# Patient Record
Sex: Female | Born: 1955 | Race: White | Hispanic: No | Marital: Married | Smoking: Current every day smoker
Health system: Southern US, Community
[De-identification: ages and names within clinical notes are randomized; demographics above are authoritative.]

## PROBLEM LIST (undated history)

## (undated) DIAGNOSIS — K529 Noninfective gastroenteritis and colitis, unspecified: Secondary | ICD-10-CM

## (undated) DIAGNOSIS — F419 Anxiety disorder, unspecified: Secondary | ICD-10-CM

## (undated) HISTORY — PX: COSMETIC SURGERY: SHX468

## (undated) HISTORY — PX: ABDOMINOPLASTY: SUR9

## (undated) HISTORY — PX: COLON SURGERY: SHX602

## (undated) HISTORY — DX: Noninfective gastroenteritis and colitis, unspecified: K52.9

## (undated) HISTORY — DX: Anxiety disorder, unspecified: F41.9

---

## 1989-08-15 HISTORY — PX: COLONOSCOPY W/ POLYPECTOMY: SHX1380

## 1998-10-15 ENCOUNTER — Other Ambulatory Visit: Admission: RE | Admit: 1998-10-15 | Discharge: 1998-10-15 | Payer: Self-pay | Admitting: Gynecology

## 1999-05-31 ENCOUNTER — Encounter: Admission: RE | Admit: 1999-05-31 | Discharge: 1999-05-31 | Payer: Self-pay | Admitting: Gynecology

## 1999-12-03 ENCOUNTER — Other Ambulatory Visit: Admission: RE | Admit: 1999-12-03 | Discharge: 1999-12-03 | Payer: Self-pay | Admitting: Gynecology

## 2000-07-17 ENCOUNTER — Encounter: Payer: Self-pay | Admitting: Gynecology

## 2000-07-17 ENCOUNTER — Encounter: Admission: RE | Admit: 2000-07-17 | Discharge: 2000-07-17 | Payer: Self-pay | Admitting: Gynecology

## 2001-04-05 ENCOUNTER — Other Ambulatory Visit: Admission: RE | Admit: 2001-04-05 | Discharge: 2001-04-05 | Payer: Self-pay | Admitting: Gynecology

## 2002-04-08 ENCOUNTER — Other Ambulatory Visit: Admission: RE | Admit: 2002-04-08 | Discharge: 2002-04-08 | Payer: Self-pay | Admitting: Gynecology

## 2002-11-13 ENCOUNTER — Encounter: Admission: RE | Admit: 2002-11-13 | Discharge: 2002-11-13 | Payer: Self-pay | Admitting: Gynecology

## 2002-11-13 ENCOUNTER — Encounter: Payer: Self-pay | Admitting: Gynecology

## 2005-07-19 ENCOUNTER — Other Ambulatory Visit: Admission: RE | Admit: 2005-07-19 | Discharge: 2005-07-19 | Payer: Self-pay | Admitting: Gynecology

## 2005-08-01 ENCOUNTER — Encounter: Admission: RE | Admit: 2005-08-01 | Discharge: 2005-08-01 | Payer: Self-pay | Admitting: Gynecology

## 2006-09-07 ENCOUNTER — Encounter: Admission: RE | Admit: 2006-09-07 | Discharge: 2006-09-07 | Payer: Self-pay | Admitting: Gynecology

## 2011-08-23 ENCOUNTER — Other Ambulatory Visit: Payer: Self-pay | Admitting: Gynecology

## 2011-08-23 DIAGNOSIS — Z1231 Encounter for screening mammogram for malignant neoplasm of breast: Secondary | ICD-10-CM

## 2011-09-05 ENCOUNTER — Ambulatory Visit
Admission: RE | Admit: 2011-09-05 | Discharge: 2011-09-05 | Disposition: A | Discharge status: Home / Self Care | Payer: BC Managed Care – PPO | Source: Ambulatory Visit | Attending: Gynecology | Admitting: Gynecology

## 2011-09-05 DIAGNOSIS — Z1231 Encounter for screening mammogram for malignant neoplasm of breast: Secondary | ICD-10-CM

## 2011-09-09 ENCOUNTER — Other Ambulatory Visit: Payer: Self-pay | Admitting: Gynecology

## 2011-09-09 DIAGNOSIS — R928 Other abnormal and inconclusive findings on diagnostic imaging of breast: Secondary | ICD-10-CM

## 2011-09-19 ENCOUNTER — Ambulatory Visit
Admission: RE | Admit: 2011-09-19 | Discharge: 2011-09-19 | Disposition: A | Discharge status: Home / Self Care | Payer: BC Managed Care – PPO | Source: Ambulatory Visit | Attending: Gynecology | Admitting: Gynecology

## 2011-09-19 DIAGNOSIS — R928 Other abnormal and inconclusive findings on diagnostic imaging of breast: Secondary | ICD-10-CM

## 2013-07-09 ENCOUNTER — Other Ambulatory Visit: Payer: Self-pay

## 2013-07-09 DIAGNOSIS — Z1231 Encounter for screening mammogram for malignant neoplasm of breast: Secondary | ICD-10-CM

## 2013-08-19 ENCOUNTER — Ambulatory Visit
Admission: RE | Admit: 2013-08-19 | Discharge: 2013-08-19 | Disposition: A | Discharge status: Home / Self Care | Payer: BC Managed Care – PPO | Source: Ambulatory Visit

## 2013-08-19 DIAGNOSIS — Z1231 Encounter for screening mammogram for malignant neoplasm of breast: Secondary | ICD-10-CM

## 2015-05-06 ENCOUNTER — Ambulatory Visit (INDEPENDENT_AMBULATORY_CARE_PROVIDER_SITE_OTHER): Payer: BLUE CROSS/BLUE SHIELD | Admitting: Physician Assistant

## 2015-05-06 VITALS — BP 126/68 | HR 73 | Temp 98.8°F | Resp 18 | Ht 64.5 in | Wt 124.0 lb

## 2015-05-06 DIAGNOSIS — R300 Dysuria: Secondary | ICD-10-CM | POA: Diagnosis not present

## 2015-05-06 LAB — POCT URINALYSIS DIP (MANUAL ENTRY)
BILIRUBIN UA: NEGATIVE
Blood, UA: NEGATIVE
Glucose, UA: NEGATIVE
Ketones, POC UA: NEGATIVE
LEUKOCYTES UA: NEGATIVE
NITRITE UA: NEGATIVE
PH UA: 7.5
PROTEIN UA: NEGATIVE
Spec Grav, UA: 1.02
Urobilinogen, UA: 1

## 2015-05-06 LAB — POC MICROSCOPIC URINALYSIS (UMFC): Mucus: ABSENT

## 2015-05-06 MED ORDER — NITROFURANTOIN MONOHYD MACRO 100 MG PO CAPS: 100.0000 mg | ORAL_CAPSULE | Freq: Two times a day (BID) | ORAL | Status: AC

## 2015-05-06 MED ORDER — PHENAZOPYRIDINE HCL 200 MG PO TABS: 200.0000 mg | ORAL_TABLET | Freq: Three times a day (TID) | ORAL | Status: DC | PRN

## 2015-05-06 NOTE — Progress Notes (Signed)
   Brandi Lin  MRN: [id number] DOB: 1956/03/27  Subjective:  Pt presents to clinic with about 6 hours of dysuria, urinary frequency and  Urgency.  She was woken with the urge to urinate and then this did not stopped.  Currently the symptoms has gotten better.  She has tried nothing OTC.  She does not get regular UTIs, last one several months ago and then before that years ago.  She is sexually active without a change in partner and she is having no vaginal symptoms.  There are no active problems to display for this patient.   No current outpatient prescriptions on file prior to visit.   No current facility-administered medications on file prior to visit.    No Known Allergies  Review of Systems  Constitutional: Negative for fever and chills.  Gastrointestinal: Negative for nausea, vomiting and abdominal pain.  Genitourinary: Positive for dysuria, urgency and frequency. Negative for vaginal discharge.  Musculoskeletal: Negative for back pain.   Objective:  BP 126/68 mmHg  Pulse 73  Temp(Src) 98.8 F (37.1 C) (Oral)  Resp 18  Ht 5' 4.5" (1.638 m)  Wt 124 lb (56.246 kg)  BMI 20.96 kg/m2  SpO2 99%  Physical Exam  Constitutional: She is oriented to person, place, and time and well-developed, well-nourished, and in no distress.  HENT:  Head: Normocephalic and atraumatic.  Right Ear: External ear normal.  Left Ear: External ear normal.  Cardiovascular: Normal rate, regular rhythm and normal heart sounds.   No murmur heard. Pulmonary/Chest: Effort normal and breath sounds normal.  Abdominal: Soft. There is no CVA tenderness.  Neurological: She is alert and oriented to person, place, and time. Gait normal.  Skin: Skin is warm and dry.  Psychiatric: Mood, memory, affect and judgment normal.  Vitals reviewed.  Results for orders placed or performed in visit on 05/06/15  POCT urinalysis dipstick  Result Value Ref Range   Color, UA other (A) yellow   Clarity, UA cloudy (A)  clear   Glucose, UA negative negative   Bilirubin, UA negative negative   Ketones, POC UA negative negative   Spec Grav, UA 1.020    Blood, UA negative negative   pH, UA 7.5    Protein Ur, POC negative negative   Urobilinogen, UA 1.0    Nitrite, UA Negative Negative   Leukocytes, UA Negative Negative  POCT Microscopic Urinalysis ([facility])  Result Value Ref Range   WBC,UR,HPF,POC Few (A) None WBC/hpf   RBC,UR,HPF,POC None None RBC/hpf   Bacteria None None   Mucus Absent Absent   Epithelial Cells, UR Per Microscopy None None cells/hpf    Assessment and Plan :  Burning with urination - Plan: POCT urinalysis dipstick, POCT Microscopic Urinalysis ([facility]), Urine culture, phenazopyridine (PYRIDIUM) 200 MG tablet, nitrofurantoin, macrocrystal-monohydrate, (MACROBID) 100 MG capsule  Push fluids.  Pt does not have an obvious UTI but it is early and she has symptoms so we will treat.  Benny Lennert PA-C  [facility] 05/06/2015 11:37 AM

## 2015-05-08 LAB — URINE CULTURE: Colony Count: >=100000

## 2015-08-31 ENCOUNTER — Ambulatory Visit (INDEPENDENT_AMBULATORY_CARE_PROVIDER_SITE_OTHER): Payer: BLUE CROSS/BLUE SHIELD | Admitting: Family Medicine

## 2015-08-31 VITALS — BP 126/76 | HR 91 | Temp 98.6°F | Resp 17 | Ht 64.5 in | Wt 127.0 lb

## 2015-08-31 DIAGNOSIS — H00011 Hordeolum externum right upper eyelid: Secondary | ICD-10-CM

## 2015-08-31 NOTE — Progress Notes (Signed)
   Subjective:    Patient ID: Brandi Lin, female    DOB: 27-Mar-1956, 60 y.o.   MRN: [id number]  HPI Patient presents today with 2 days of upper right eye lid pain and swelling. She has not had any fever, eye pain, runny nose, sore throat or cough. She has not had any visual changes or foreign body sensation. She was around her grandson who had pink eye last week, but she has not had any conjunctival redness.   She has regular care from Dr. Kirby FunkJohn Lin at [facility] and reports that she is up to date on PAP, mammogram. She takes no medications.   No past medical history on file. Past Surgical History  Procedure Laterality Date  . Colon surgery    . Cosmetic surgery     Family History  Problem Relation Age of Onset  . Cancer Mother   . Cancer Maternal Grandmother   . Cancer Maternal Grandfather   . Cancer Paternal Grandfather    Social History   Social History  . Marital Status: Married    Spouse Name: N/A  . Number of Children: N/A  . Years of Education: N/A   Occupational History  . Not on file.   Social History Main Topics  . Smoking status: Current Every Day Smoker  . Smokeless tobacco: Not on file     Comment: 2 packs weekly   . Alcohol Use: 3.6 oz/week    6 Standard drinks or equivalent per week  . Drug Use: No  . Sexual Activity: Not on file   Other Topics Concern  . Not on file   Social History Narrative   Review of Systems Per HPI    Objective:   Physical Exam  Constitutional: She is oriented to person, place, and time. She appears well-developed and well-nourished.  HENT:  Head: Normocephalic and atraumatic.  Eyes: Conjunctivae and EOM are normal. Pupils are equal, round, and reactive to light. Right eye exhibits hordeolum. Right eye exhibits no discharge. Left eye exhibits no discharge.    Cardiovascular: Normal rate and regular rhythm.   Pulmonary/Chest: Effort normal.  Musculoskeletal: Normal range of motion.  Neurological: She is alert and  oriented to person, place, and time.  Skin: Skin is warm and dry.  Psychiatric: She has a normal mood and affect. Her behavior is normal. Judgment and thought content normal.  Vitals reviewed.    BP 126/76 mmHg  Pulse 91  Temp(Src) 98.6 F (37 C) (Oral)  Resp 17  Ht 5' 4.5" (1.638 m)  Wt 127 lb (57.607 kg)  BMI 21.47 kg/m2  SpO2 99%      Assessment & Plan:  1. Hordeolum externum of right upper eyelid - Provided written and verbal information regarding diagnosis and treatment. - instructed to use warm compresses multiple times a day, avoid rubbing and squeezing - RTC if eye pain occurs or visual changes   Brandi Reeeborah Kolin Erdahl, FNP-BC  [facility]  08/31/2015 11:06 AM

## 2015-08-31 NOTE — Patient Instructions (Signed)
Stye A stye is a bump on your eyelid caused by a bacterial infection. A stye can form inside the eyelid (internal stye) or outside the eyelid (external stye). An internal stye may be caused by an infected oil-producing gland inside your eyelid. An external stye may be caused by an infection at the base of your eyelash (hair follicle). Styes are very common. Anyone can get them at any age. They usually occur in just one eye, but you may have more than one in either eye.  CAUSES  The infection is almost always caused by bacteria called Staphylococcus aureus. This is a common type of bacteria that lives on your skin. RISK FACTORS You may be at higher risk for a stye if you have had one before. You may also be at higher risk if you have:  Diabetes.  Long-term illness.  Long-term eye redness.  A skin condition called seborrhea.  High fat levels in your blood (lipids). SIGNS AND SYMPTOMS  Eyelid pain is the most common symptom of a stye. Internal styes are more painful than external styes. Other signs and symptoms may include:  Painful swelling of your eyelid.  A scratchy feeling in your eye.  Tearing and redness of your eye.  Pus draining from the stye. DIAGNOSIS  Your health care provider may be able to diagnose a stye just by examining your eye. The health care provider may also check to make sure:  You do not have a fever or other signs of a more serious infection.  The infection has not spread to other parts of your eye or areas around your eye. TREATMENT  Most styes will clear up in a few days without treatment. In some cases, you may need to use antibiotic drops or ointment to prevent infection. Your health care provider may have to drain the stye surgically if your stye is:  Large.  Causing a lot of pain.  Interfering with your vision. This can be done using a thin blade or a needle.  HOME CARE INSTRUCTIONS   Take medicines only as directed by your health care  provider.  Apply a clean, warm compress to your eye for 10 minutes, 4 times a day.  Do not wear contact lenses or eye makeup until your stye has healed.  Do not try to pop or drain the stye. SEEK MEDICAL CARE IF:  You have chills or a fever.  Your stye does not go away after several days.  Your stye affects your vision.  Your eyeball becomes swollen, red, or painful. MAKE SURE YOU:  Understand these instructions.  Will watch your condition.  Will get help right away if you are not doing well or get worse.   This information is not intended to replace advice given to you by your health care provider. Make sure you discuss any questions you have with your health care provider.   Document Released: 05/11/2005 Document Revised: 08/22/2014 Document Reviewed: 11/15/2013 Elsevier Interactive Patient Education 2016 Elsevier Inc.  

## 2015-10-15 ENCOUNTER — Other Ambulatory Visit (HOSPITAL_COMMUNITY)
Admission: RE | Admit: 2015-10-15 | Discharge: 2015-10-15 | Disposition: A | Discharge status: Home / Self Care | Payer: BLUE CROSS/BLUE SHIELD | Source: Ambulatory Visit | Attending: Family Medicine | Admitting: Family Medicine

## 2015-10-15 ENCOUNTER — Other Ambulatory Visit: Payer: Self-pay | Admitting: Family Medicine

## 2015-10-15 DIAGNOSIS — Z124 Encounter for screening for malignant neoplasm of cervix: Secondary | ICD-10-CM | POA: Diagnosis not present

## 2015-10-16 LAB — CYTOLOGY - PAP

## 2016-11-17 ENCOUNTER — Other Ambulatory Visit: Payer: Self-pay | Admitting: Family Medicine

## 2016-11-17 DIAGNOSIS — Z1231 Encounter for screening mammogram for malignant neoplasm of breast: Secondary | ICD-10-CM

## 2016-11-24 ENCOUNTER — Ambulatory Visit: Payer: BLUE CROSS/BLUE SHIELD

## 2016-12-05 ENCOUNTER — Ambulatory Visit
Admission: RE | Admit: 2016-12-05 | Discharge: 2016-12-05 | Disposition: A | Discharge status: Home / Self Care | Payer: PRIVATE HEALTH INSURANCE | Source: Ambulatory Visit | Attending: Family Medicine | Admitting: Family Medicine

## 2016-12-05 DIAGNOSIS — Z1231 Encounter for screening mammogram for malignant neoplasm of breast: Secondary | ICD-10-CM

## 2018-07-31 ENCOUNTER — Other Ambulatory Visit: Payer: Self-pay | Admitting: Family Medicine

## 2018-07-31 DIAGNOSIS — Z1231 Encounter for screening mammogram for malignant neoplasm of breast: Secondary | ICD-10-CM

## 2018-09-06 ENCOUNTER — Ambulatory Visit
Admission: RE | Admit: 2018-09-06 | Discharge: 2018-09-06 | Disposition: A | Discharge status: Home / Self Care | Payer: 59 | Source: Ambulatory Visit | Attending: Family Medicine | Admitting: Family Medicine

## 2018-09-06 DIAGNOSIS — Z1231 Encounter for screening mammogram for malignant neoplasm of breast: Secondary | ICD-10-CM

## 2019-03-29 ENCOUNTER — Ambulatory Visit
Admission: RE | Admit: 2019-03-29 | Discharge: 2019-03-29 | Disposition: A | Discharge status: Home / Self Care | Payer: 59 | Source: Ambulatory Visit | Attending: Family Medicine | Admitting: Family Medicine

## 2019-03-29 ENCOUNTER — Other Ambulatory Visit: Payer: Self-pay | Admitting: Family Medicine

## 2019-03-29 DIAGNOSIS — M79621 Pain in right upper arm: Secondary | ICD-10-CM

## 2019-10-05 ENCOUNTER — Ambulatory Visit: Payer: 59 | Attending: Internal Medicine

## 2019-10-05 DIAGNOSIS — Z23 Encounter for immunization: Secondary | ICD-10-CM

## 2019-10-05 NOTE — Progress Notes (Signed)
   Covid-19 Vaccination Clinic  Name:  Brandi Lin    MRN: [id number] DOB: 1956-07-08  10/05/2019  [name] was observed post Covid-19 immunization for 15 minutes without incidence. She was provided with Vaccine Information Sheet and instruction to access the V-Safe system.   [name] was instructed to call 911 with any severe reactions post vaccine: Marland Kitchen Difficulty breathing  . Swelling of your face and throat  . A fast heartbeat  . A bad rash all over your body  . Dizziness and weakness    Immunizations Administered    Name Date Dose VIS Date Route   Pfizer COVID-19 Vaccine 10/05/2019  9:21 AM 0.3 mL 07/26/2019 Intramuscular   Manufacturer: [employer], [employer]   Lot: [id number]   NDC: [address]-6

## 2019-10-28 ENCOUNTER — Ambulatory Visit: Payer: 59 | Attending: Internal Medicine

## 2019-10-28 DIAGNOSIS — Z23 Encounter for immunization: Secondary | ICD-10-CM

## 2019-10-28 NOTE — Progress Notes (Signed)
   Covid-19 Vaccination Clinic  Name:  Brandi Lin    MRN: [id number] DOB: [birth year]  10/28/2019  [name] was observed post Covid-19 immunization for 15 minutes without incident. She was provided with Vaccine Information Sheet and instruction to access the V-Safe system.   [name] was instructed to call 911 with any severe reactions post vaccine: Marland Kitchen Difficulty breathing  . Swelling of face and throat  . A fast heartbeat  . A bad rash all over body  . Dizziness and weakness

## 2019-10-30 ENCOUNTER — Other Ambulatory Visit: Payer: Self-pay | Admitting: Family Medicine

## 2019-10-30 DIAGNOSIS — Z1231 Encounter for screening mammogram for malignant neoplasm of breast: Secondary | ICD-10-CM

## 2019-11-19 ENCOUNTER — Ambulatory Visit: Payer: 59

## 2019-12-09 ENCOUNTER — Ambulatory Visit
Admission: RE | Admit: 2019-12-09 | Discharge: 2019-12-09 | Disposition: A | Discharge status: Home / Self Care | Payer: 59 | Source: Ambulatory Visit | Attending: Family Medicine | Admitting: Family Medicine

## 2019-12-09 ENCOUNTER — Other Ambulatory Visit: Payer: Self-pay

## 2019-12-09 DIAGNOSIS — Z1231 Encounter for screening mammogram for malignant neoplasm of breast: Secondary | ICD-10-CM

## 2020-02-05 ENCOUNTER — Other Ambulatory Visit (HOSPITAL_COMMUNITY)
Admission: RE | Admit: 2020-02-05 | Discharge: 2020-02-05 | Disposition: A | Discharge status: Home / Self Care | Payer: 59 | Source: Ambulatory Visit | Attending: Family Medicine | Admitting: Family Medicine

## 2020-02-05 DIAGNOSIS — Z124 Encounter for screening for malignant neoplasm of cervix: Secondary | ICD-10-CM | POA: Diagnosis present

## 2020-02-06 ENCOUNTER — Other Ambulatory Visit: Payer: Self-pay | Admitting: Family Medicine

## 2020-02-06 DIAGNOSIS — E2839 Other primary ovarian failure: Secondary | ICD-10-CM

## 2020-02-07 LAB — CYTOLOGY - PAP: Diagnosis: NEGATIVE

## 2020-02-28 ENCOUNTER — Inpatient Hospital Stay: Admission: RE | Admit: 2020-02-28 | Payer: 59 | Source: Ambulatory Visit

## 2020-03-05 ENCOUNTER — Ambulatory Visit
Admission: RE | Admit: 2020-03-05 | Discharge: 2020-03-05 | Disposition: A | Discharge status: Home / Self Care | Payer: 59 | Source: Ambulatory Visit | Attending: Family Medicine | Admitting: Family Medicine

## 2020-03-05 ENCOUNTER — Other Ambulatory Visit: Payer: Self-pay

## 2020-03-05 DIAGNOSIS — E2839 Other primary ovarian failure: Secondary | ICD-10-CM

## 2021-03-26 IMAGING — MG DIGITAL SCREENING BILAT W/ TOMO W/ CAD
6 of 10 series · 6 of 30 positions shown · non-contrast
Comparison: Previous exam(s).

CLINICAL DATA: Screening.

EXAM:
DIGITAL SCREENING BILATERAL MAMMOGRAM WITH TOMO AND CAD

[L CC synth-2D]
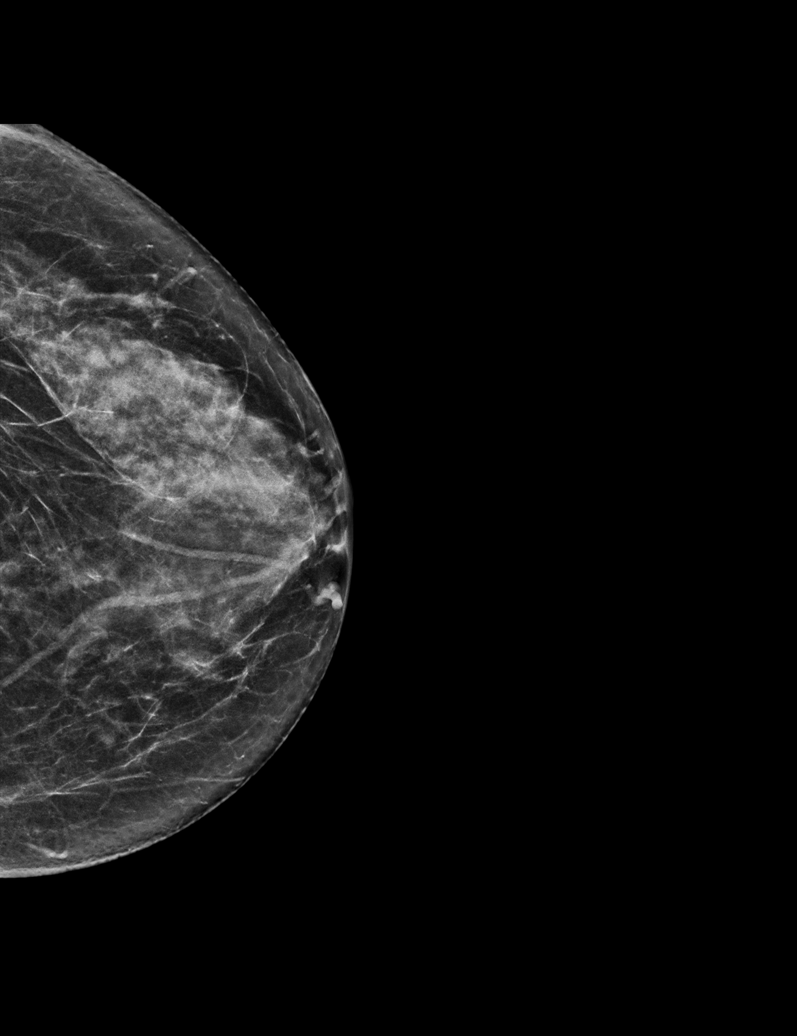

[R MLO synth-2D]
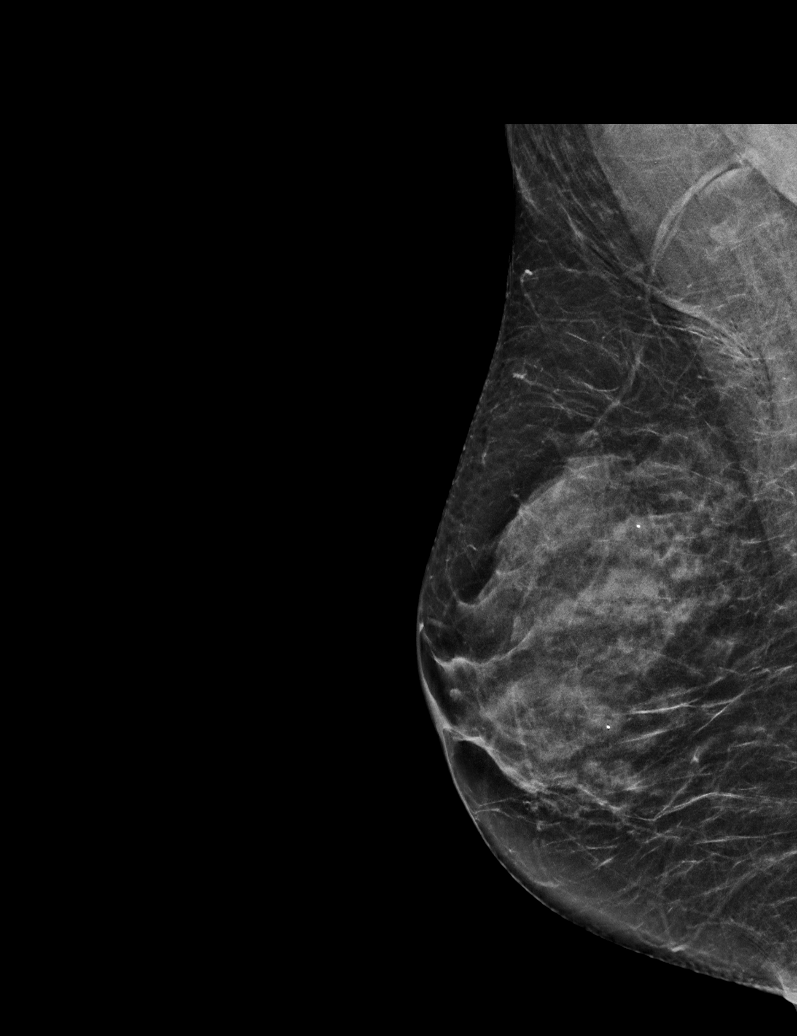

[R CC synth-2D]
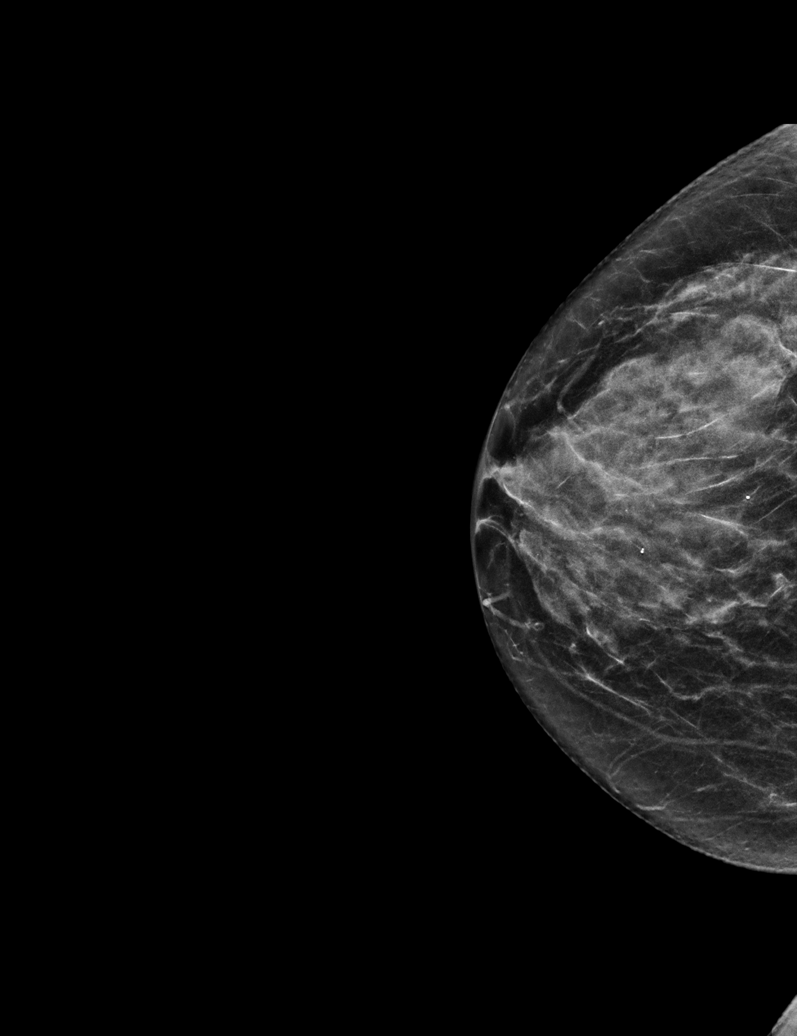

[L MLO synth-2D (1 of 2)]
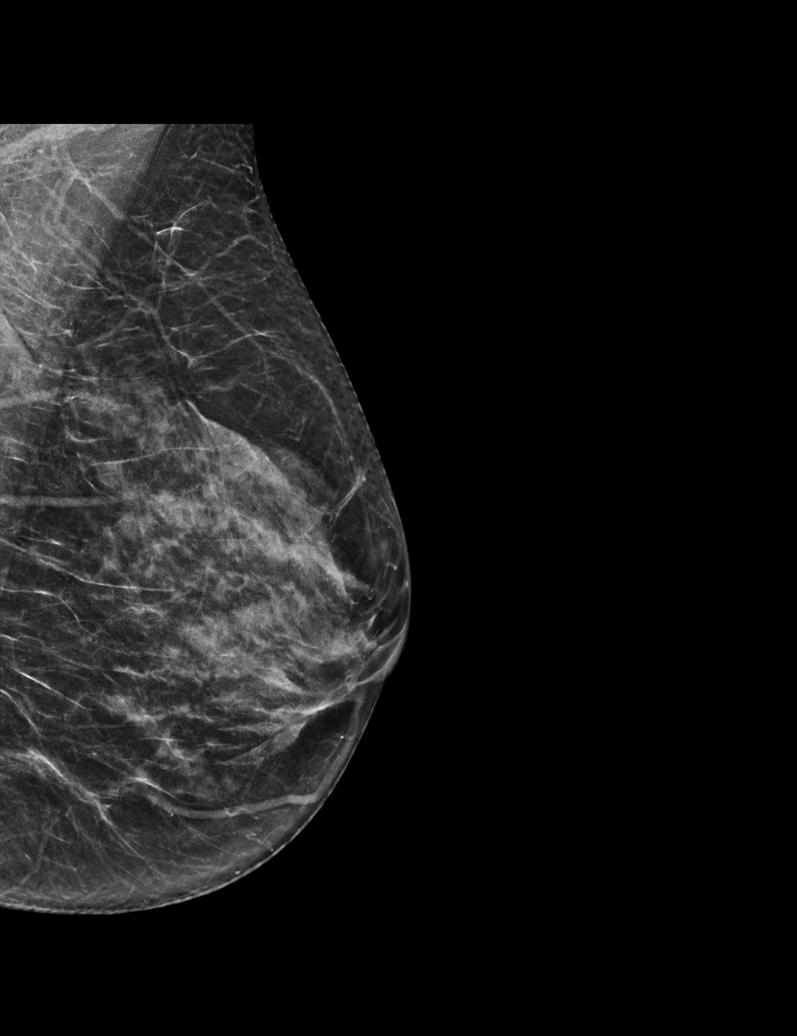

[L MLO synth-2D (2 of 2)]
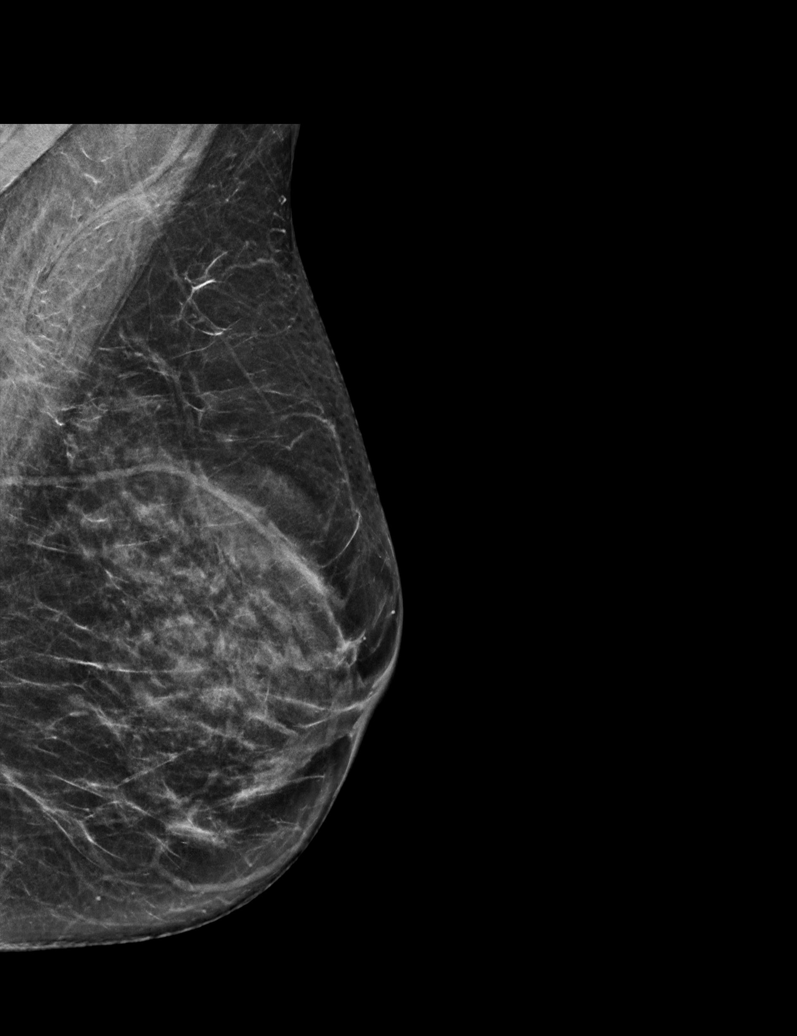

[R MLO tomo · tomo slice 29/56.0]
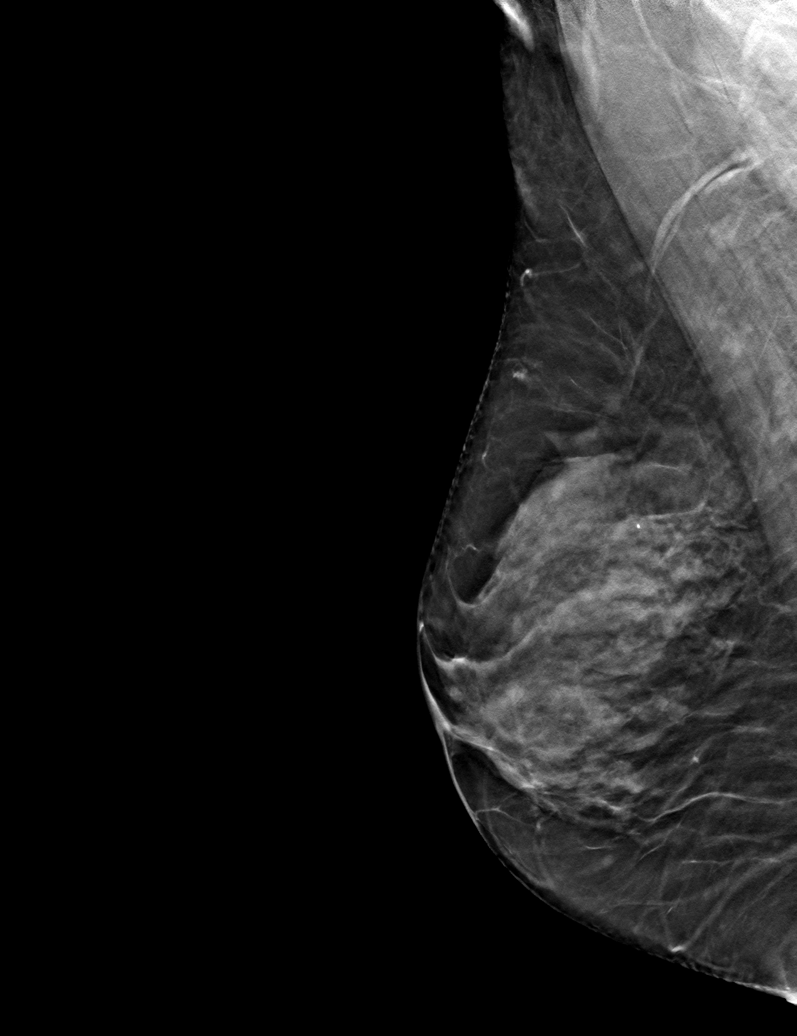

[6 of 30 positions shown; findings below may reference images not displayed]

ACR Breast Density Category c: The breast tissue is heterogeneously
dense, which may obscure small masses.
FINDINGS: There are no findings suspicious for malignancy. Images were
processed with CAD.
IMPRESSION: No mammographic evidence of malignancy. A result letter of this
screening mammogram will be mailed directly to the patient.

RECOMMENDATION:
Screening mammogram in one year. (Code:[id number])

BI-RADS CATEGORY  1: Negative.

## 2022-02-02 DIAGNOSIS — S93602A Unspecified sprain of left foot, initial encounter: Secondary | ICD-10-CM | POA: Diagnosis not present

## 2022-02-02 DIAGNOSIS — M79672 Pain in left foot: Secondary | ICD-10-CM | POA: Diagnosis not present

## 2022-02-24 DIAGNOSIS — Z13220 Encounter for screening for lipoid disorders: Secondary | ICD-10-CM | POA: Diagnosis not present

## 2022-02-24 DIAGNOSIS — M8588 Other specified disorders of bone density and structure, other site: Secondary | ICD-10-CM | POA: Diagnosis not present

## 2022-02-24 DIAGNOSIS — F411 Generalized anxiety disorder: Secondary | ICD-10-CM | POA: Diagnosis not present

## 2022-02-24 DIAGNOSIS — Z0000 Encounter for general adult medical examination without abnormal findings: Secondary | ICD-10-CM | POA: Diagnosis not present

## 2022-02-24 DIAGNOSIS — Z800 Family history of malignant neoplasm of digestive organs: Secondary | ICD-10-CM | POA: Diagnosis not present

## 2022-02-24 DIAGNOSIS — Z86010 Personal history of colonic polyps: Secondary | ICD-10-CM | POA: Diagnosis not present

## 2022-02-24 DIAGNOSIS — Z136 Encounter for screening for cardiovascular disorders: Secondary | ICD-10-CM | POA: Diagnosis not present

## 2022-02-24 DIAGNOSIS — F17200 Nicotine dependence, unspecified, uncomplicated: Secondary | ICD-10-CM | POA: Diagnosis not present

## 2022-03-09 ENCOUNTER — Other Ambulatory Visit: Payer: Self-pay | Admitting: Family Medicine

## 2022-03-09 DIAGNOSIS — Z1231 Encounter for screening mammogram for malignant neoplasm of breast: Secondary | ICD-10-CM

## 2022-03-30 ENCOUNTER — Ambulatory Visit
Admission: RE | Admit: 2022-03-30 | Discharge: 2022-03-30 | Disposition: A | Discharge status: Home / Self Care | Payer: PPO | Source: Ambulatory Visit | Attending: Family Medicine | Admitting: Family Medicine

## 2022-03-30 DIAGNOSIS — Z1231 Encounter for screening mammogram for malignant neoplasm of breast: Secondary | ICD-10-CM | POA: Diagnosis not present

## 2022-06-08 DIAGNOSIS — Z411 Encounter for cosmetic surgery: Secondary | ICD-10-CM | POA: Diagnosis not present

## 2022-06-08 DIAGNOSIS — L989 Disorder of the skin and subcutaneous tissue, unspecified: Secondary | ICD-10-CM | POA: Diagnosis not present

## 2022-12-31 ENCOUNTER — Other Ambulatory Visit: Payer: Self-pay

## 2022-12-31 ENCOUNTER — Emergency Department (HOSPITAL_COMMUNITY): Payer: PPO

## 2022-12-31 ENCOUNTER — Encounter (HOSPITAL_COMMUNITY): Payer: Self-pay | Admitting: *Deleted

## 2022-12-31 ENCOUNTER — Inpatient Hospital Stay (HOSPITAL_COMMUNITY)
Admission: EM | Admit: 2022-12-31 | Discharge: 2023-01-02 | DRG: 392 | Disposition: A | Discharge status: Home / Self Care | Payer: PPO | Attending: Internal Medicine | Admitting: Internal Medicine

## 2022-12-31 DIAGNOSIS — K5900 Constipation, unspecified: Secondary | ICD-10-CM | POA: Diagnosis not present

## 2022-12-31 DIAGNOSIS — K529 Noninfective gastroenteritis and colitis, unspecified: Secondary | ICD-10-CM | POA: Diagnosis not present

## 2022-12-31 DIAGNOSIS — F17200 Nicotine dependence, unspecified, uncomplicated: Secondary | ICD-10-CM | POA: Diagnosis present

## 2022-12-31 DIAGNOSIS — N9489 Other specified conditions associated with female genital organs and menstrual cycle: Secondary | ICD-10-CM

## 2022-12-31 DIAGNOSIS — R1904 Left lower quadrant abdominal swelling, mass and lump: Secondary | ICD-10-CM | POA: Diagnosis present

## 2022-12-31 DIAGNOSIS — Z8719 Personal history of other diseases of the digestive system: Secondary | ICD-10-CM

## 2022-12-31 DIAGNOSIS — R1032 Left lower quadrant pain: Secondary | ICD-10-CM | POA: Diagnosis not present

## 2022-12-31 DIAGNOSIS — K644 Residual hemorrhoidal skin tags: Secondary | ICD-10-CM | POA: Diagnosis present

## 2022-12-31 DIAGNOSIS — Z809 Family history of malignant neoplasm, unspecified: Secondary | ICD-10-CM

## 2022-12-31 DIAGNOSIS — K5909 Other constipation: Secondary | ICD-10-CM | POA: Diagnosis present

## 2022-12-31 DIAGNOSIS — K5289 Other specified noninfective gastroenteritis and colitis: Secondary | ICD-10-CM

## 2022-12-31 LAB — URINALYSIS, ROUTINE W REFLEX MICROSCOPIC
Bilirubin Urine: NEGATIVE
Glucose, UA: NEGATIVE mg/dL
Ketones, ur: 5 mg/dL — AB
Leukocytes,Ua: NEGATIVE
Nitrite: NEGATIVE
Protein, ur: 100 mg/dL — AB
Specific Gravity, Urine: 1.029 (ref 1.005–1.030)
pH: 5 (ref 5.0–8.0)

## 2022-12-31 LAB — CBC WITH DIFFERENTIAL/PLATELET
Abs Immature Granulocytes: 0.06 10*3/uL (ref 0.00–0.07)
Basophils Absolute: 0 10*3/uL (ref 0.0–0.1)
Basophils Relative: 0 %
Eosinophils Absolute: 0 10*3/uL (ref 0.0–0.5)
Eosinophils Relative: 0 %
HCT: 40.6 % (ref 36.0–46.0)
Hemoglobin: 13.8 g/dL (ref 12.0–15.0)
Immature Granulocytes: 1 %
Lymphocytes Relative: 9 %
Lymphs Abs: 1.1 10*3/uL (ref 0.7–4.0)
MCH: 32.9 pg (ref 26.0–34.0)
MCHC: 34 g/dL (ref 30.0–36.0)
MCV: 96.9 fL (ref 80.0–100.0)
Monocytes Absolute: 0.9 10*3/uL (ref 0.1–1.0)
Monocytes Relative: 7 %
Neutro Abs: 10.2 10*3/uL — ABNORMAL HIGH (ref 1.7–7.7)
Neutrophils Relative %: 83 %
Platelets: 235 10*3/uL (ref 150–400)
RBC: 4.19 MIL/uL (ref 3.87–5.11)
RDW: 11.4 % — ABNORMAL LOW (ref 11.5–15.5)
WBC: 12.2 10*3/uL — ABNORMAL HIGH (ref 4.0–10.5)
nRBC: 0 % (ref 0.0–0.2)

## 2022-12-31 LAB — COMPREHENSIVE METABOLIC PANEL
ALT: 14 U/L (ref 0–44)
AST: 14 U/L — ABNORMAL LOW (ref 15–41)
Albumin: 4.1 g/dL (ref 3.5–5.0)
Alkaline Phosphatase: 62 U/L (ref 38–126)
Anion gap: 11 (ref 5–15)
BUN: 11 mg/dL (ref 8–23)
CO2: 25 mmol/L (ref 22–32)
Calcium: 9.2 mg/dL (ref 8.9–10.3)
Chloride: 95 mmol/L — ABNORMAL LOW (ref 98–111)
Creatinine, Ser: 0.81 mg/dL (ref 0.44–1.00)
GFR, Estimated: >60 mL/min (ref >60)
Glucose, Bld: 113 mg/dL — ABNORMAL HIGH (ref 70–99)
Potassium: 4.2 mmol/L (ref 3.5–5.1)
Sodium: 131 mmol/L — ABNORMAL LOW (ref 135–145)
Total Bilirubin: 1 mg/dL (ref 0.3–1.2)
Total Protein: 7.9 g/dL (ref 6.5–8.1)

## 2022-12-31 LAB — LIPASE, BLOOD: Lipase: 28 U/L (ref 11–51)

## 2022-12-31 MED ORDER — FLEET ENEMA 7-19 GM/118ML RE ENEM
1.0000 | ENEMA | Freq: Once | RECTAL | Status: AC
  Administered 2022-12-31: 1 via RECTAL
  Filled 2022-12-31: qty 1

## 2022-12-31 MED ORDER — IOHEXOL 300 MG/ML  SOLN
100.0000 mL | Freq: Once | INTRAMUSCULAR | Status: AC | PRN
  Administered 2022-12-31: 100 mL via INTRAVENOUS

## 2022-12-31 MED ORDER — SODIUM CHLORIDE 0.9 % IV SOLN: INTRAVENOUS | Status: AC

## 2022-12-31 MED ORDER — ENOXAPARIN SODIUM 40 MG/0.4ML IJ SOSY
40.0000 mg | PREFILLED_SYRINGE | INTRAMUSCULAR | Status: DC
  Administered 2022-12-31 – 2023-01-01 (×2): 40 mg via SUBCUTANEOUS
  Filled 2022-12-31 (×3): qty 0.4

## 2022-12-31 MED ORDER — POLYETHYLENE GLYCOL 3350 17 G PO PACK
17.0000 g | PACK | Freq: Every day | ORAL | Status: DC
  Administered 2022-12-31: 17 g via ORAL
  Filled 2022-12-31: qty 1

## 2022-12-31 MED ORDER — METRONIDAZOLE 500 MG/100ML IV SOLN
500.0000 mg | Freq: Two times a day (BID) | INTRAVENOUS | Status: DC
  Administered 2022-12-31 – 2023-01-02 (×4): 500 mg via INTRAVENOUS
  Filled 2022-12-31 (×4): qty 100

## 2022-12-31 MED ORDER — FENTANYL CITRATE PF 50 MCG/ML IJ SOSY
25.0000 ug | PREFILLED_SYRINGE | Freq: Once | INTRAMUSCULAR | Status: AC
  Administered 2022-12-31: 25 ug via INTRAVENOUS
  Filled 2022-12-31: qty 1

## 2022-12-31 MED ORDER — SODIUM CHLORIDE 0.9 % IV SOLN
2.0000 g | INTRAVENOUS | Status: DC
  Administered 2023-01-01 – 2023-01-02 (×2): 2 g via INTRAVENOUS
  Filled 2022-12-31 (×2): qty 20

## 2022-12-31 MED ORDER — BISACODYL 5 MG PO TBEC
5.0000 mg | DELAYED_RELEASE_TABLET | Freq: Every day | ORAL | Status: DC
  Administered 2022-12-31 – 2023-01-02 (×3): 5 mg via ORAL
  Filled 2022-12-31 (×3): qty 1

## 2022-12-31 MED ORDER — SODIUM CHLORIDE 0.9 % IV SOLN
1.0000 g | Freq: Once | INTRAVENOUS | Status: AC
  Administered 2022-12-31: 1 g via INTRAVENOUS
  Filled 2022-12-31: qty 10

## 2022-12-31 MED ORDER — POLYETHYLENE GLYCOL 3350 17 G PO PACK
17.0000 g | PACK | Freq: Two times a day (BID) | ORAL | Status: DC
  Administered 2023-01-01 – 2023-01-02 (×3): 17 g via ORAL
  Filled 2022-12-31 (×3): qty 1

## 2022-12-31 NOTE — Assessment & Plan Note (Addendum)
-  incidential finding on CT showed 3.9 cm heterogeneously enhancing mass within the left adnexa, indeterminate. This may represent an exophytic or broad ligament fibroid, a primary ovarian mass, or potentially an acutely torsed left ovary. Correlation with dedicated pelvic sonography outpatient is recommended for further evaluation. -she has LLQ tenderness but suspect due to colitis and fecal impaction rather than ovarian torsion. She is otherwise comfortable except during exam with palpation. Continue to monitor clinically.

## 2022-12-31 NOTE — ED Notes (Signed)
ED TO INPATIENT HANDOFF REPORT  ED Nurse Name and Phone #: Deon Pilling [phone]  S Name/Age/Gender Brandi Lin 67 y.o. female Room/Bed: WA20/WA20  Code Status   Code Status: Full Code  Home/SNF/Other Home Patient oriented to: self, place, time, and situation Is this baseline? Yes   Triage Complete: Triage complete  Chief Complaint Colitis [K52.9]  Triage Note No BM since Last Sunday, noted to be hard in form. Denies N/V/D Abd cramps reported   Allergies No Known Allergies  Level of Care/Admitting Diagnosis ED Disposition     ED Disposition  Admit   Condition  --   Comment  Hospital [facility]: [facility] [[id number]]  Level of Care: Med-Surg [16]  May place patient in observation at [facility] or [facility] if equivalent level of care is available:: No  Covid Evaluation: Asymptomatic - no recent exposure (last 10 days) testing not required  Diagnosis: Colitis [202542]  Admitting Physician: Anselm Jungling [[id number]]  Attending Physician: Anselm Jungling [[id number]]          B Medical/Surgery History History reviewed. No pertinent past medical history. Past Surgical History:  Procedure Laterality Date   COLON SURGERY     COSMETIC SURGERY       A IV Location/Drains/Wounds Patient Lines/Drains/Airways Status     Active Line/Drains/Airways     Name Placement date Placement time Site Days   Peripheral IV 12/31/22 20 G Left Antecubital 12/31/22  1748  Antecubital  less than 1            Intake/Output Last 24 hours No intake or output data in the 24 hours ending 12/31/22 2136  Labs/Imaging Results for orders placed or performed during the hospital encounter of 12/31/22 (from the past 48 hour(s))  Comprehensive metabolic panel     Status: Abnormal   Collection Time: 12/31/22  5:57 PM  Result Value Ref Range   Sodium 131 (L) 135 - 145 mmol/L   Potassium 4.2 3.5 - 5.1 mmol/L   Chloride 95 (L) 98 - 111 mmol/L   CO2 25 22 - 32 mmol/L   Glucose,  Bld 113 (H) 70 - 99 mg/dL    Comment: Glucose reference range applies only to samples taken after fasting for at least 8 hours.   BUN 11 8 - 23 mg/dL   Creatinine, Ser 6.16 0.44 - 1.00 mg/dL   Calcium 9.2 8.9 - 07.3 mg/dL   Total Protein 7.9 6.5 - 8.1 g/dL   Albumin 4.1 3.5 - 5.0 g/dL   AST 14 (L) 15 - 41 U/L   ALT 14 0 - 44 U/L   Alkaline Phosphatase 62 38 - 126 U/L   Total Bilirubin 1.0 0.3 - 1.2 mg/dL   GFR, Estimated >71 >06 mL/min    Comment: (NOTE) Calculated using the CKD-EPI Creatinine Equation (2021)    Anion gap 11 5 - 15    Comment: Performed at [facility], [address] [place], [place] [address]  Lipase, blood     Status: None   Collection Time: 12/31/22  5:57 PM  Result Value Ref Range   Lipase 28 11 - 51 U/L    Comment: Performed at [facility], [address] [place], [place] [address]  CBC with Diff     Status: Abnormal   Collection Time: 12/31/22  5:57 PM  Result Value Ref Range   WBC 12.2 (H) 4.0 - 10.5 K/uL   RBC 4.19 3.87 - 5.11 MIL/uL  Hemoglobin 13.8 12.0 - 15.0 g/dL   HCT 16.1 09.6 - 04.5 %   MCV 96.9 80.0 - 100.0 fL   MCH 32.9 26.0 - 34.0 pg   MCHC 34.0 30.0 - 36.0 g/dL   RDW 40.9 (L) 81.1 - 91.4 %   Platelets 235 150 - 400 K/uL   nRBC 0.0 0.0 - 0.2 %   Neutrophils Relative % 83 %   Neutro Abs 10.2 (H) 1.7 - 7.7 K/uL   Lymphocytes Relative 9 %   Lymphs Abs 1.1 0.7 - 4.0 K/uL   Monocytes Relative 7 %   Monocytes Absolute 0.9 0.1 - 1.0 K/uL   Eosinophils Relative 0 %   Eosinophils Absolute 0.0 0.0 - 0.5 K/uL   Basophils Relative 0 %   Basophils Absolute 0.0 0.0 - 0.1 K/uL   Immature Granulocytes 1 %   Abs Immature Granulocytes 0.06 0.00 - 0.07 K/uL    Comment: Performed at [facility], [address] [place], [place] [address]  Urinalysis, Routine w reflex microscopic -Urine, Clean Catch     Status: Abnormal   Collection Time: 12/31/22  5:57 PM  Result Value Ref Range   Color,  Urine YELLOW YELLOW   APPearance CLEAR CLEAR   Specific Gravity, Urine 1.029 1.005 - 1.030   pH 5.0 5.0 - 8.0   Glucose, UA NEGATIVE NEGATIVE mg/dL   Hgb urine dipstick LARGE (A) NEGATIVE   Bilirubin Urine NEGATIVE NEGATIVE   Ketones, ur 5 (A) NEGATIVE mg/dL   Protein, ur 621 (A) NEGATIVE mg/dL   Nitrite NEGATIVE NEGATIVE   Leukocytes,Ua NEGATIVE NEGATIVE   RBC / HPF 21-50 0 - 5 RBC/hpf   WBC, UA 0-5 0 - 5 WBC/hpf   Bacteria, UA RARE (A) NONE SEEN   Squamous Epithelial / HPF 0-5 0 - 5 /HPF   Mucus PRESENT    Hyaline Casts, UA PRESENT     Comment: Performed at [facility], [address] [place], [place] [address]   CT ABDOMEN PELVIS W CONTRAST  Result Date: 12/31/2022 CLINICAL DATA:  Lower abdominal pain EXAM: CT ABDOMEN AND PELVIS WITH CONTRAST TECHNIQUE: Multidetector CT imaging of the abdomen and pelvis was performed using the standard protocol following bolus administration of intravenous contrast. RADIATION DOSE REDUCTION: This exam was performed according to the departmental dose-optimization program which includes automated exposure control, adjustment of the mA and/or kV according to patient size and/or use of iterative reconstruction technique. CONTRAST:  OMNIPAQUE IOHEXOL 300 MG/ML  SOLN COMPARISON:  None Available. FINDINGS: Lower chest: No acute abnormality. Hepatobiliary: Ill-defined hypodensity within the right hepatic lobe, axial image # [id number], series # 2 is indeterminate, possibly representing a tiny cyst or hemangioma in a patient without a history of malignancy. Mild hepatic steatosis. No enhancing intrahepatic mass. No intra or extrahepatic biliary ductal dilation. Gallbladder unremarkable. Pancreas: Unremarkable Spleen: Unremarkable Adrenals/Urinary Tract: The adrenal glands are unremarkable. The kidneys are normal in size and position. Multiple simple cortical cysts are seen within the upper pole the left kidney for which no follow-up imaging is  recommended. The kidneys are otherwise unremarkable. The bladder is unremarkable Stomach/Bowel: There is large volume stool seen throughout the colon with more solid form stool noted within the sigmoid colon. In this region, there is relatively long segment circumferential bowel wall thickening and pericolonic inflammatory stranding suggesting changes of stercoral colitis. There is superimposed moderate sigmoid diverticulosis. The stomach, small bowel, and large bowel are otherwise unremarkable. No free intraperitoneal gas or  fluid. No loculated intra-abdominal fluid collections. Vascular/Lymphatic: Aortic atherosclerosis. No enlarged abdominal or pelvic lymph nodes. Reproductive: A heterogeneously enhancing mass is seen within the left adnexa measuring 2.9 x 3.4 x 3.9 cm which is indeterminate, possibly representing an exophytic or broad ligament fibroid, a primary ovarian mass, or potentially an acutely torsed left ovary. The uterus is unremarkable. No adnexal masses seen on the right. Other: No abdominal wall hernia or abnormality. No abdominopelvic ascites. Musculoskeletal: Osseous structures are age-appropriate. No acute bone abnormality. No lytic or blastic bone lesion. IMPRESSION: 1. Large volume stool seen throughout the colon with more solid form stool noted within the sigmoid colon. In this region, there is relatively long segment circumferential bowel wall thickening and pericolonic inflammatory stranding suggesting changes of stercoral colitis. 2. 3.9 cm heterogeneously enhancing mass within the left adnexa, indeterminate. This may represent an exophytic or broad ligament fibroid, a primary ovarian mass, or potentially an acutely torsed left ovary. Correlation with dedicated pelvic sonography is recommended for further evaluation. 3. Mild hepatic steatosis. 4. Aortic atherosclerosis. Aortic Atherosclerosis (ICD10-I70.0). Electronically Signed   By: Helyn Numbers M.D.   On: 12/31/2022 19:20    Pending  Labs Unresulted Labs (From admission, onward)     Start     Ordered   01/01/23 0500  CBC  Tomorrow morning,   R        12/31/22 2135   01/01/23 0500  Basic metabolic panel  Tomorrow morning,   R        12/31/22 2135   12/31/22 2135  HIV Antibody (routine testing w rflx)  (HIV Antibody (Routine testing w reflex) panel)  Once,   R        12/31/22 2135            Vitals/Pain Today's Vitals   12/31/22 1830 12/31/22 1834 12/31/22 1845 12/31/22 1949  BP: 126/86  121/79   Pulse: (!) 105  97   Resp:   16   Temp:    98.9 F (37.2 C)  TempSrc:      SpO2: 97%  97%   Weight:      Height:      PainSc:  4       Isolation Precautions No active isolations  Medications Medications  metroNIDAZOLE (FLAGYL) IVPB 500 mg (500 mg Intravenous New Bag/Given 12/31/22 2120)  polyethylene glycol (MIRALAX / GLYCOLAX) packet 17 g (17 g Oral Given 12/31/22 2119)  0.9 %  sodium chloride infusion (has no administration in time range)  enoxaparin (LOVENOX) injection 40 mg (has no administration in time range)  fentaNYL (SUBLIMAZE) injection 25 mcg (25 mcg Intravenous Given 12/31/22 1753)  iohexol (OMNIPAQUE) 300 MG/ML solution 100 mL (100 mLs Intravenous Contrast Given 12/31/22 1854)  cefTRIAXone (ROCEPHIN) 1 g in sodium chloride 0.9 % 100 mL IVPB (1 g Intravenous New Bag/Given 12/31/22 2030)  sodium phosphate (FLEET) 7-19 GM/118ML enema 1 enema (1 enema Rectal Given 12/31/22 2119)    Mobility walks     Focused Assessments    R Recommendations: See Admitting Provider Note  Report given to:   Additional Notes:

## 2022-12-31 NOTE — ED Provider Notes (Signed)
[place] EMERGENCY DEPARTMENT AT [facility] Provider Note   CSN: [id number] Arrival date & time: 12/31/22  1539     History {Add pertinent medical, surgical, social history, OB history to HPI:1} Chief Complaint  Patient presents with   Constipation    Brandi Lin is a 67 y.o. female otherwise healthy presents today for evaluation of abdominal pain and constipation.  Patient reports constipation for about 6 days and last bowel movement was on 5/12.  She has tried Colace, MiraLAX, glycerin suppository with no relief.  She reports abdominal pain located in her lower abdomen, more on the left lower quadrant, she described it as spasm, nonradiating.  Denies any fever, nausea, vomiting, urinary symptoms.  She denies any blood in her stool or urine.   Constipation    History reviewed. No pertinent past medical history. Past Surgical History:  Procedure Laterality Date   COLON SURGERY     COSMETIC SURGERY       Home Medications Prior to Admission medications   Not on File      Allergies    Patient has no known allergies.    Review of Systems   Review of Systems  Gastrointestinal:  Positive for constipation.    Physical Exam Updated Vital Signs BP (!) 149/94 (BP Location: Left Arm)   Pulse (!) 106   Temp 99.1 F (37.3 C) (Oral)   Resp 16   Ht 5\' 4"  (1.626 m)   Wt 53.1 kg   SpO2 100%   BMI 20.08 kg/m  Physical Exam Vitals and nursing note reviewed.  Constitutional:      Appearance: Normal appearance.  HENT:     Head: Normocephalic and atraumatic.     Mouth/Throat:     Mouth: Mucous membranes are moist.  Eyes:     General: No scleral icterus. Cardiovascular:     Rate and Rhythm: Normal rate and regular rhythm.     Pulses: Normal pulses.     Heart sounds: Normal heart sounds.  Pulmonary:     Effort: Pulmonary effort is normal.     Breath sounds: Normal breath sounds.  Abdominal:     General: Abdomen is flat.     Palpations: Abdomen is  soft.     Tenderness: There is no abdominal tenderness.  Musculoskeletal:        General: No deformity.     Comments: Tenderness to palpation to left lower quadrant, right lower quadrant and suprapubic.  Skin:    General: Skin is warm.     Findings: No rash.  Neurological:     General: No focal deficit present.     Mental Status: She is alert.  Psychiatric:        Mood and Affect: Mood normal.     ED Results / Procedures / Treatments   Labs (all labs ordered are listed, but only abnormal results are displayed) Labs Reviewed - No data to display  EKG None  Radiology No results found.  Procedures Procedures  {Document cardiac monitor, telemetry assessment procedure when appropriate:1}  Medications Ordered in ED Medications - No data to display  ED Course/ Medical Decision Making/ A&P Clinical Course as of 12/31/22 2100  Sat Dec 31, 2022  2010 I was consulted regarding treatment plan for this patient.  67 year old female presenting with difficulty with bowel movements and abdominal pain.  Improved with fentanyl administration per PA. CT demonstrates stercoral colitis with large volume stool diffusely throughout the colon. White count elevated 12.2. Will  initiate IV antibiotics, aggressive bowel regimen and arrange for admission for anticipatory care. [CC]    Clinical Course User Index [CC] Glyn Ade, MD   {   Click here for ABCD2, HEART and other calculatorsREFRESH Note before signing :1}                          Medical Decision Making Amount and/or Complexity of Data Reviewed Labs: ordered. Radiology: ordered.  Risk OTC drugs. Prescription drug management. Decision regarding hospitalization.   ***  {Document critical care time when appropriate:1} {Document review of labs and clinical decision tools ie heart score, Chads2Vasc2 etc:1}  {Document your independent review of radiology images, and any outside records:1} {Document your discussion with  family members, caretakers, and with consultants:1} {Document social determinants of health affecting pt's care:1} {Document your decision making why or why not admission, treatments were needed:1} Final Clinical Impression(s) / ED Diagnoses Final diagnoses:  None    Rx / DC Orders ED Discharge Orders     None

## 2022-12-31 NOTE — ED Triage Notes (Signed)
No BM since Last Sunday, noted to be hard in form. Denies N/V/D Abd cramps reported

## 2022-12-31 NOTE — Discharge Instructions (Signed)
Please follow up with your primary care doctor within 2-3 days. For constipation we also recommend a diet high in fiber (beans, fruits, vegetables, whole grains). Take Colace 100-200 mg up to three times per day. You may take along with Senokot 1-2 tabs, ingest with full glass of water.  You may also take MiraLAX 1-2 capfuls 1-2 times a day until stools become soft and then slowly decrease the amount of MiraLAX used.  Maintain fluid intake 6-8 glasses per day. Please increase fibers in your diet. You may also take Milk of Magnesia 30 mL as needed for constipation, you may repeat in 2 hours again if no bowl movement. 

## 2022-12-31 NOTE — H&P (Signed)
History and Physical    Patient: Brandi Lin ZOX:[id number] DOB: 05-31-1956 DOA: 12/31/2022 DOS: the patient was seen and examined on 12/31/2022 PCP: Maurice Small, MD  Patient coming from: Home  Chief Complaint:  Chief Complaint  Patient presents with   Constipation   HPI: DELANCEY Lin is a 67 y.o. female with medical history significant of C-sections, colon polyps presents with constipation and abdominal cramping.   No issues with chronic constipation but a week ago started to notice stool was hard. Then stopped having bowel movement 6 days ago with left lower abdominal pain. Started doing Miralax about 3 days ago and Colace today without relieve. Has also tried suppository. Eating more fiber for breakfast and drinking prune juice. Still burping and passing gas.  Has hx of C-sections. Get routine colonoscopy with [facility] but cannot recall timing of last colonoscopy. Reports findings of pre-cancerous polyps.   In the ED, she was afebrile, normotensive, mildly tachycardic.  Leukocytosis of 12.2. No anemia.   No significant electrolyte abnormalities on CMP. LFTs within normal limits.  CT abd/pelv w contrast showed large volume stool throughout colon and sigmoid colon. There is findings suggestive of stercoral colitis.  She was started on IV antibiotics and had fleet enema ordered.   Review of Systems: As mentioned in the history of present illness. All other systems reviewed and are negative. History reviewed. No pertinent past medical history. Past Surgical History:  Procedure Laterality Date   COLON SURGERY     COSMETIC SURGERY     Social History:  reports that she has been smoking. She does not have any smokeless tobacco history on file. She reports current alcohol use of about 6.0 standard drinks of alcohol per week. She reports that she does not use drugs.  No Known Allergies  Family History  Problem Relation Age of Onset   Cancer Mother    Cancer Maternal  Grandmother    Cancer Maternal Grandfather    Cancer Paternal Grandfather     Prior to Admission medications   Medication Sig Start Date End Date Taking? Authorizing Provider  buPROPion (WELLBUTRIN XL) 150 MG 24 hr tablet Take 150 mg by mouth every morning. 09/22/22  Yes [provider]    Physical Exam: Vitals:   12/31/22 1830 12/31/22 1845 12/31/22 1949 12/31/22 2233  BP: 126/86 121/79  (!) 142/95  Pulse: (!) 105 97    Resp:  16  17  Temp:   98.9 F (37.2 C) 99.1 F (37.3 C)  TempSrc:    Oral  SpO2: 97% 97%  99%  Weight:      Height:       Constitutional: NAD, calm, comfortable, elderly female sitting upright on side of bed looking at  cellphone Eyes: lids and conjunctivae normal ENMT: Mucous membranes are moist.  Neck: normal, supple Respiratory:clear to auscultation and no wheezing, no crackles. Normal respiratory effort. No accessory muscle use. On room air.  Cardiovascular: Regular rate and rhythm, no murmurs / rubs / gallops. No extremity edema.  Abdomen: soft, moderate tenderness to LLQ, non-distended. No rebound tenderness, guarding or rigidity. Bowel sounds positive.  Rectal: several external hemorrhoids. No stool seen on visual exam in rectal vault.  Musculoskeletal: no clubbing / cyanosis. No joint or extremity deformities Skin: no rashes, lesions, ulcers. No induration Neurologic: CN 2-12 grossly intact.  Strength 5/5 in all 4.  Psychiatric: Normal judgment and insight. Alert and oriented x 3. Normal mood. Data Reviewed:  See HPI  Assessment and  Plan: * Colitis -Sterocoral colitis from chronic constipation -continue IV Rocephin and Flagyl -Full liquid diet for bowel rest and advance as tolerated  Constipation -pt opted for trial of fleet enema in ED rather than manual disimpaction. Fleet enema unfortunately produced minimal liquid stool output although pt feels her fullness has transitioned further to rectal region. No stool seen in rectal vault on  visualization during exam. Will attempt a soap sud enema overnight. If no good results, will need to hold on anymore enemas in 24 hours.  -continue daily bisacodyl and BID Miralax -keep on gentle IV fluid hydration -Check TSH  Adnexal mass -incidential finding on CT showed 3.9 cm heterogeneously enhancing mass within the left adnexa, indeterminate. This may represent an exophytic or broad ligament fibroid, a primary ovarian mass, or potentially an acutely torsed left ovary. Correlation with dedicated pelvic sonography outpatient is recommended for further evaluation. -she has LLQ tenderness but suspect due to colitis and fecal impaction rather than ovarian torsion. She is otherwise comfortable except during exam with palpation. Continue to monitor clinically.       Advance Care Planning:   Code Status: Full Code   Consults: none  Family Communication: none at bedside  Severity of Illness: The appropriate patient status for this patient is OBSERVATION. Observation status is judged to be reasonable and necessary in order to provide the required intensity of service to ensure the patient's safety. The patient's presenting symptoms, physical exam findings, and initial radiographic and laboratory data in the context of their medical condition is felt to place them at decreased risk for further clinical deterioration. Furthermore, it is anticipated that the patient will be medically stable for discharge from the hospital within 2 midnights of admission.   Author: Anselm Jungling, DO 12/31/2022 11:17 PM  For on call review [email or web address].

## 2022-12-31 NOTE — Assessment & Plan Note (Addendum)
-  pt opted for trial of fleet enema in ED rather than manual disimpaction. Fleet enema unfortunately produced minimal liquid stool output although pt feels her fullness has transitioned further to rectal region. No stool seen in rectal vault on visualization during exam. Will attempt a soap sud enema overnight. If no good results, will need to hold on anymore enemas in 24 hours.  -continue daily bisacodyl and BID Miralax -keep on gentle IV fluid hydration -Check TSH

## 2022-12-31 NOTE — Assessment & Plan Note (Signed)
-  Sterocoral colitis from chronic constipation -continue IV Rocephin and Flagyl -Full liquid diet for bowel rest and advance as tolerated

## 2023-01-01 ENCOUNTER — Observation Stay (HOSPITAL_COMMUNITY): Payer: PPO

## 2023-01-01 DIAGNOSIS — K5900 Constipation, unspecified: Secondary | ICD-10-CM | POA: Diagnosis not present

## 2023-01-01 DIAGNOSIS — N9489 Other specified conditions associated with female genital organs and menstrual cycle: Secondary | ICD-10-CM

## 2023-01-01 DIAGNOSIS — R1904 Left lower quadrant abdominal swelling, mass and lump: Secondary | ICD-10-CM | POA: Diagnosis present

## 2023-01-01 DIAGNOSIS — Z809 Family history of malignant neoplasm, unspecified: Secondary | ICD-10-CM | POA: Diagnosis not present

## 2023-01-01 DIAGNOSIS — Z8719 Personal history of other diseases of the digestive system: Secondary | ICD-10-CM | POA: Diagnosis not present

## 2023-01-01 DIAGNOSIS — K5289 Other specified noninfective gastroenteritis and colitis: Secondary | ICD-10-CM | POA: Diagnosis present

## 2023-01-01 DIAGNOSIS — R1032 Left lower quadrant pain: Secondary | ICD-10-CM | POA: Diagnosis present

## 2023-01-01 DIAGNOSIS — K644 Residual hemorrhoidal skin tags: Secondary | ICD-10-CM | POA: Diagnosis present

## 2023-01-01 DIAGNOSIS — K5909 Other constipation: Secondary | ICD-10-CM | POA: Diagnosis present

## 2023-01-01 DIAGNOSIS — F17200 Nicotine dependence, unspecified, uncomplicated: Secondary | ICD-10-CM | POA: Diagnosis present

## 2023-01-01 DIAGNOSIS — K529 Noninfective gastroenteritis and colitis, unspecified: Secondary | ICD-10-CM | POA: Diagnosis not present

## 2023-01-01 LAB — TSH: TSH: 0.922 u[IU]/mL (ref 0.350–4.500)

## 2023-01-01 LAB — CBC
HCT: 36.8 % (ref 36.0–46.0)
Hemoglobin: 12.5 g/dL (ref 12.0–15.0)
MCH: 33 pg (ref 26.0–34.0)
MCHC: 34 g/dL (ref 30.0–36.0)
MCV: 97.1 fL (ref 80.0–100.0)
Platelets: 218 10*3/uL (ref 150–400)
RBC: 3.79 MIL/uL — ABNORMAL LOW (ref 3.87–5.11)
RDW: 11.4 % — ABNORMAL LOW (ref 11.5–15.5)
WBC: 12.1 10*3/uL — ABNORMAL HIGH (ref 4.0–10.5)
nRBC: 0 % (ref 0.0–0.2)

## 2023-01-01 LAB — HIV ANTIBODY (ROUTINE TESTING W REFLEX): HIV Screen 4th Generation wRfx: NONREACTIVE

## 2023-01-01 LAB — BASIC METABOLIC PANEL
Anion gap: 11 (ref 5–15)
BUN: 11 mg/dL (ref 8–23)
CO2: 24 mmol/L (ref 22–32)
Calcium: 8.8 mg/dL — ABNORMAL LOW (ref 8.9–10.3)
Chloride: 99 mmol/L (ref 98–111)
Creatinine, Ser: 0.68 mg/dL (ref 0.44–1.00)
GFR, Estimated: >60 mL/min (ref >60)
Glucose, Bld: 104 mg/dL — ABNORMAL HIGH (ref 70–99)
Potassium: 3.9 mmol/L (ref 3.5–5.1)
Sodium: 134 mmol/L — ABNORMAL LOW (ref 135–145)

## 2023-01-01 MED ORDER — ACETAMINOPHEN 500 MG PO TABS
1000.0000 mg | ORAL_TABLET | Freq: Four times a day (QID) | ORAL | Status: DC | PRN
  Administered 2023-01-01: 1000 mg via ORAL
  Filled 2023-01-01: qty 2

## 2023-01-01 MED ORDER — KETOROLAC TROMETHAMINE 15 MG/ML IJ SOLN
15.0000 mg | Freq: Once | INTRAMUSCULAR | Status: AC
  Administered 2023-01-01: 15 mg via INTRAVENOUS
  Filled 2023-01-01: qty 1

## 2023-01-01 MED ORDER — ORAL CARE MOUTH RINSE: 15.0000 mL | OROMUCOSAL | Status: DC | PRN

## 2023-01-01 MED ORDER — SORBITOL 70 % SOLN
960.0000 mL | TOPICAL_OIL | Freq: Once | ORAL | Status: AC
  Administered 2023-01-01: 960 mL via RECTAL
  Filled 2023-01-01: qty 240

## 2023-01-01 MED ORDER — SODIUM CHLORIDE 0.9 % IV SOLN: INTRAVENOUS | Status: DC | PRN

## 2023-01-01 NOTE — Progress Notes (Signed)
[facility] PROGRESS NOTE    Progress Note  Brandi Lin  WJX:[id number] DOB: 02/26/1956 DOA: 12/31/2022 PCP: Maurice Small, MD     Brief Narrative:   Brandi Lin is an 67 y.o. female past medical history significant for C-section, colonic polyp presents with constipation and abdominal cramping.  She relates her bowel movement was 6 days prior to admission started MiraLAX and Colace 3 days ago.  Assessment/Plan:   Sterocoral Colitis: From chronic constipation. Was started on IV Rocephin and Flagyl for liquid diet. Continue IV fluids and oral hydration.  Constipation The patient will need Fleet enema instead of disimpaction. Unfortunately Fleet enemas produced minimal liquid. Subset enemas overnight were attempted.  Adnexal mass: CT scan of the abdomen pelvis showed 3.9 cm enhancing mass of the left adnexa. Check a pelvic ultrasound. Her abdominal exam is benign, unlikely an ovarian torsion.   DVT prophylaxis: lovenox Family Communication:None Status is: Observation The patient remains OBS appropriate and will d/c before 2 midnights.    Code Status:     Code Status Orders  (From admission, onward)           Start     Ordered   12/31/22 2136  Full code  Continuous       Question:  By:  Answer:  Consent: discussion documented in EHR   12/31/22 2135           Code Status History     This patient has a current code status but no historical code status.         IV Access:   Peripheral IV   Procedures and diagnostic studies:   CT ABDOMEN PELVIS W CONTRAST  Result Date: 12/31/2022 CLINICAL DATA:  Lower abdominal pain EXAM: CT ABDOMEN AND PELVIS WITH CONTRAST TECHNIQUE: Multidetector CT imaging of the abdomen and pelvis was performed using the standard protocol following bolus administration of intravenous contrast. RADIATION DOSE REDUCTION: This exam was performed according to the departmental dose-optimization program which includes  automated exposure control, adjustment of the mA and/or kV according to patient size and/or use of iterative reconstruction technique. CONTRAST:  OMNIPAQUE IOHEXOL 300 MG/ML  SOLN COMPARISON:  None Available. FINDINGS: Lower chest: No acute abnormality. Hepatobiliary: Ill-defined hypodensity within the right hepatic lobe, axial image # [id number], series # 2 is indeterminate, possibly representing a tiny cyst or hemangioma in a patient without a history of malignancy. Mild hepatic steatosis. No enhancing intrahepatic mass. No intra or extrahepatic biliary ductal dilation. Gallbladder unremarkable. Pancreas: Unremarkable Spleen: Unremarkable Adrenals/Urinary Tract: The adrenal glands are unremarkable. The kidneys are normal in size and position. Multiple simple cortical cysts are seen within the upper pole the left kidney for which no follow-up imaging is recommended. The kidneys are otherwise unremarkable. The bladder is unremarkable Stomach/Bowel: There is large volume stool seen throughout the colon with more solid form stool noted within the sigmoid colon. In this region, there is relatively long segment circumferential bowel wall thickening and pericolonic inflammatory stranding suggesting changes of stercoral colitis. There is superimposed moderate sigmoid diverticulosis. The stomach, small bowel, and large bowel are otherwise unremarkable. No free intraperitoneal gas or fluid. No loculated intra-abdominal fluid collections. Vascular/Lymphatic: Aortic atherosclerosis. No enlarged abdominal or pelvic lymph nodes. Reproductive: A heterogeneously enhancing mass is seen within the left adnexa measuring 2.9 x 3.4 x 3.9 cm which is indeterminate, possibly representing an exophytic or broad ligament fibroid, a primary ovarian mass, or potentially an acutely torsed left ovary. The uterus is unremarkable. No  adnexal masses seen on the right. Other: No abdominal wall hernia or abnormality. No abdominopelvic ascites.  Musculoskeletal: Osseous structures are age-appropriate. No acute bone abnormality. No lytic or blastic bone lesion. IMPRESSION: 1. Large volume stool seen throughout the colon with more solid form stool noted within the sigmoid colon. In this region, there is relatively long segment circumferential bowel wall thickening and pericolonic inflammatory stranding suggesting changes of stercoral colitis. 2. 3.9 cm heterogeneously enhancing mass within the left adnexa, indeterminate. This may represent an exophytic or broad ligament fibroid, a primary ovarian mass, or potentially an acutely torsed left ovary. Correlation with dedicated pelvic sonography is recommended for further evaluation. 3. Mild hepatic steatosis. 4. Aortic atherosclerosis. Aortic Atherosclerosis (ICD10-I70.0). Electronically Signed   By: Helyn Numbers M.D.   On: 12/31/2022 19:20     Medical Consultants:   None.   Subjective:    Brandi Lin some abdominal discomfort, tolerating her diet  Objective:    Vitals:   12/31/22 1949 12/31/22 2233 01/01/23 0148 01/01/23 0525  BP:  (!) 142/95 120/83 132/83  Pulse:    99  Resp:  17 17 17   Temp: 98.9 F (37.2 C) 99.1 F (37.3 C) 99.3 F (37.4 C) 98.3 F (36.8 C)  TempSrc:  Oral Oral Oral  SpO2:  99% 98% 98%  Weight:      Height:       SpO2: 98 %   Intake/Output Summary (Last 24 hours) at 01/01/2023 1610 Last data filed at 01/01/2023 0600 Gross per 24 hour  Intake 1157.6 ml  Output --  Net 1157.6 ml   Filed Weights   12/31/22 1555  Weight: 53.1 kg    Exam: General exam: In no acute distress. Respiratory system: Good air movement and clear to auscultation. Cardiovascular system: S1 & S2 heard, RRR. No JVD. Gastrointestinal system: Abdomen is nondistended, soft and nontender.  Extremities: No pedal edema. Skin: No rashes, lesions or ulcers Psychiatry: Judgement and insight appear normal. Mood & affect appropriate.    Data Reviewed:    Labs: Basic  Metabolic Panel: Recent Labs  Lab 12/31/22 1757 01/01/23 0309  NA 131* 134*  K 4.2 3.9  CL 95* 99  CO2 25 24  GLUCOSE 113* 104*  BUN 11 11  CREATININE 0.81 0.68  CALCIUM 9.2 8.8*   GFR Estimated Creatinine Clearance: 58 mL/min (by C-G formula based on SCr of 0.68 mg/dL). Liver Function Tests: Recent Labs  Lab 12/31/22 1757  AST 14*  ALT 14  ALKPHOS 62  BILITOT 1.0  PROT 7.9  ALBUMIN 4.1   Recent Labs  Lab 12/31/22 1757  LIPASE 28   No results for input(s): "AMMONIA" in the last 168 hours. Coagulation profile No results for input(s): "INR", "PROTIME" in the last 168 hours. COVID-19 Labs  No results for input(s): "DDIMER", "FERRITIN", "LDH", "CRP" in the last 72 hours.  No results found for: "SARSCOV2NAA"  CBC: Recent Labs  Lab 12/31/22 1757 01/01/23 0309  WBC 12.2* 12.1*  NEUTROABS 10.2*  --   HGB 13.8 12.5  HCT 40.6 36.8  MCV 96.9 97.1  PLT 235 218   Cardiac Enzymes: No results for input(s): "CKTOTAL", "CKMB", "CKMBINDEX", "TROPONINI" in the last 168 hours. BNP (last 3 results) No results for input(s): "PROBNP" in the last 8760 hours. CBG: No results for input(s): "GLUCAP" in the last 168 hours. D-Dimer: No results for input(s): "DDIMER" in the last 72 hours. Hgb A1c: No results for input(s): "HGBA1C" in the last 72 hours. Lipid  Profile: No results for input(s): "CHOL", "HDL", "LDLCALC", "TRIG", "CHOLHDL", "LDLDIRECT" in the last 72 hours. Thyroid function studies: Recent Labs    01/01/23 0309  TSH 0.922   Anemia work up: No results for input(s): "VITAMINB12", "FOLATE", "FERRITIN", "TIBC", "IRON", "RETICCTPCT" in the last 72 hours. Sepsis Labs: Recent Labs  Lab 12/31/22 1757 01/01/23 0309  WBC 12.2* 12.1*   Microbiology No results found for this or any previous visit (from the past 240 hour(s)).   Medications:    bisacodyl  5 mg Oral Daily   enoxaparin (LOVENOX) injection  40 mg Subcutaneous Q24H   polyethylene glycol  17 g Oral  BID   Continuous Infusions:  sodium chloride 75 mL/hr at 12/31/22 2254   cefTRIAXone (ROCEPHIN)  IV     metronidazole 500 mg (12/31/22 2120)      LOS: 0 days   Marinda Elk  [facility]  01/01/2023, 7:22 AM

## 2023-01-01 NOTE — Plan of Care (Signed)
?  Problem: Health Behavior/Discharge Planning: ?Goal: Ability to manage health-related needs will improve ?Outcome: Progressing ?  ?Problem: Activity: ?Goal: Risk for activity intolerance will decrease ?Outcome: Progressing ?  ?Problem: Elimination: ?Goal: Will not experience complications related to bowel motility ?Outcome: Progressing ?  ?

## 2023-01-02 DIAGNOSIS — K529 Noninfective gastroenteritis and colitis, unspecified: Secondary | ICD-10-CM | POA: Diagnosis not present

## 2023-01-02 DIAGNOSIS — K5900 Constipation, unspecified: Secondary | ICD-10-CM | POA: Diagnosis not present

## 2023-01-02 DIAGNOSIS — K5289 Other specified noninfective gastroenteritis and colitis: Secondary | ICD-10-CM

## 2023-01-02 MED ORDER — POLYETHYLENE GLYCOL 3350 17 G PO PACK: 17.0000 g | PACK | Freq: Every day | ORAL | 0 refills | Status: DC | PRN

## 2023-01-02 MED ORDER — AMOXICILLIN-POT CLAVULANATE 875-125 MG PO TABS
1.0000 | ORAL_TABLET | Freq: Two times a day (BID) | ORAL | Status: DC
  Administered 2023-01-02: 1 via ORAL
  Filled 2023-01-02: qty 1

## 2023-01-02 MED ORDER — AMOXICILLIN-POT CLAVULANATE 875-125 MG PO TABS: 1.0000 | ORAL_TABLET | Freq: Two times a day (BID) | ORAL | 0 refills | Status: AC

## 2023-01-02 MED ORDER — POLYETHYLENE GLYCOL 3350 17 G PO PACK: 17.0000 g | PACK | Freq: Every day | ORAL | Status: DC | PRN

## 2023-01-02 NOTE — Discharge Summary (Signed)
Physician Discharge Summary  Brandi Lin ZOX:[id number] DOB: [birth year] DOA: 12/31/2022  PCP: Maurice Small, MD  Admit date: 12/31/2022 Discharge date: 01/02/2023  Admitted From: Home Disposition:  Home  Recommendations for Outpatient Follow-up:  Follow up with [facility] Oncology in 1-2 weeks Follow-up on CEA, inhibin a and B, CA125 and alpha-fetoprotein tumor markers  Home Health:No Equipment/Devices:None  Discharge Condition:Stable CODE STATUS:Full Diet recommendation: Heart Healthy   Brief/Interim Summary: 67 y.o. female past medical history significant for C-section, colonic polyp presents with constipation and abdominal cramping.  She relates her bowel movement was 6 days prior to admission started MiraLAX and Colace 3 days ago.   Discharge Diagnoses:  Principal Problem:   Colitis Active Problems:   Constipation   Adnexal mass   Obstipation  Steracoral colitis: From constipation was started on IV Rocephin and azithromycin was given a bowel regimen which was unsuccessful. She was given a smog enema and she had multiple bowel movements. Hemoglobin remained stable. She will continue Augmentin for 5 additional days as an outpatient.  Constipation: She received several Fleet enemas and MiraLAX which were unsuccessful. She was given a smog enema and she subsequently had multiple bowel movements.  Left adnexal mass: Seen on CT scan measuring about 3.9 cm of the left ovary, vaginal ultrasound was done that showed solid-appearing mass measuring 3 x 2.9 x 2.2 complex. CEA, inhibin a and B, CA125 and alpha-fetoprotein tumor markers were sent and we will follow-up with GYN oncology as an outpatient. Discharge Instructions  Discharge Instructions     Diet - low sodium heart healthy   Complete by: As directed    Increase activity slowly   Complete by: As directed       Allergies as of 01/02/2023   No Known Allergies      Medication List     TAKE these medications     amoxicillin-clavulanate 875-125 MG tablet Commonly known as: AUGMENTIN Take 1 tablet by mouth every 12 (twelve) hours for 4 days.   ascorbic acid 500 MG tablet Commonly known as: VITAMIN C Take 500 mg by mouth daily.   buPROPion 150 MG 24 hr tablet Commonly known as: WELLBUTRIN XL Take 150 mg by mouth every morning.   cholecalciferol 25 MCG (1000 UNIT) tablet Commonly known as: VITAMIN D3 Take 1,000 Units by mouth daily.   CITRULLINE PO Take 1 tablet by mouth daily.   Fish Oil 1000 MG Caps Take 1 capsule by mouth daily.   multivitamin with minerals Tabs tablet Take 1 tablet by mouth daily.   polyethylene glycol 17 g packet Commonly known as: MIRALAX / GLYCOLAX Take 17 g by mouth daily as needed for moderate constipation.        No Known Allergies  Consultations: None   Procedures/Studies: US PELVIC COMPLETE W TRANSVAGINAL AND TORSION R/O  Result Date: 01/01/2023 CLINICAL DATA:  67 year old female with history of left lower quadrant abdominal pain. Mass in the left adnexal region noted on recent CT examination. EXAM: TRANSABDOMINAL AND TRANSVAGINAL ULTRASOUND OF PELVIS DOPPLER ULTRASOUND OF OVARIES TECHNIQUE: Both transabdominal and transvaginal ultrasound examinations of the pelvis were performed. Transabdominal technique was performed for global imaging of the pelvis including uterus, ovaries, adnexal regions, and pelvic cul-de-sac. It was necessary to proceed with endovaginal exam following the transabdominal exam to visualize the adnexal regions. Color and duplex Doppler ultrasound was utilized to evaluate blood flow to the ovaries. COMPARISON:  No prior pelvic ultrasound. CT of the abdomen and pelvis 12/31/2022. FINDINGS: Uterus Measurements: 2.1  x 1.8 x 2.9 cm = volume: 5.7 mL. No fibroids or other mass visualized. Endometrium Thickness: 6.0 mm. Small hypoechoic regions are noted in the region of the endometrium measuring up to 7 x 6 x 6 mm, potentially small  endometrial cysts. Right ovary Measurements: 2.1 x 1.8 x 2.9 cm = volume: 5.7 mL. Normal appearance/no adnexal mass. Left ovary Measurements: 3.3 x 2.9 x 2.2 cm = volume: 11.1 mL. Heterogeneously echogenic lesion in the left ovary with some posterior acoustic shadowing measuring up to 2.8 x 2.9 x 3.1 cm with heterogeneous internal blood flow. Pulsed Doppler evaluation of both ovaries demonstrates normal low-resistance arterial and venous waveforms. Other findings No abnormal free fluid. IMPRESSION: 1. No acute findings. 2. 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 Outpatient referral to OB-GYN is recommended for further clinical evaluation and follow-up. Electronically Signed   By: Trudie Reed M.D.   On: 01/01/2023 11:23   CT ABDOMEN PELVIS W CONTRAST  Result Date: 12/31/2022 CLINICAL DATA:  Lower abdominal pain EXAM: CT ABDOMEN AND PELVIS WITH CONTRAST TECHNIQUE: Multidetector CT imaging of the abdomen and pelvis was performed using the standard protocol following bolus administration of intravenous contrast. RADIATION DOSE REDUCTION: This exam was performed according to the departmental dose-optimization program which includes automated exposure control, adjustment of the mA and/or kV according to patient size and/or use of iterative reconstruction technique. CONTRAST:  OMNIPAQUE IOHEXOL 300 MG/ML  SOLN COMPARISON:  None Available. FINDINGS: Lower chest: No acute abnormality. Hepatobiliary: Ill-defined hypodensity within the right hepatic lobe, axial image # [id number], series # 2 is indeterminate, possibly representing a tiny cyst or hemangioma in a patient without a history of malignancy. Mild hepatic steatosis. No enhancing intrahepatic mass. No intra or extrahepatic biliary ductal dilation. Gallbladder unremarkable. Pancreas: Unremarkable Spleen: Unremarkable Adrenals/Urinary Tract: The adrenal glands are unremarkable. The kidneys are normal in size and position.  Multiple simple cortical cysts are seen within the upper pole the left kidney for which no follow-up imaging is recommended. The kidneys are otherwise unremarkable. The bladder is unremarkable Stomach/Bowel: There is large volume stool seen throughout the colon with more solid form stool noted within the sigmoid colon. In this region, there is relatively long segment circumferential bowel wall thickening and pericolonic inflammatory stranding suggesting changes of stercoral colitis. There is superimposed moderate sigmoid diverticulosis. The stomach, small bowel, and large bowel are otherwise unremarkable. No free intraperitoneal gas or fluid. No loculated intra-abdominal fluid collections. Vascular/Lymphatic: Aortic atherosclerosis. No enlarged abdominal or pelvic lymph nodes. Reproductive: A heterogeneously enhancing mass is seen within the left adnexa measuring 2.9 x 3.4 x 3.9 cm which is indeterminate, possibly representing an exophytic or broad ligament fibroid, a primary ovarian mass, or potentially an acutely torsed left ovary. The uterus is unremarkable. No adnexal masses seen on the right. Other: No abdominal wall hernia or abnormality. No abdominopelvic ascites. Musculoskeletal: Osseous structures are age-appropriate. No acute bone abnormality. No lytic or blastic bone lesion. IMPRESSION: 1. Large volume stool seen throughout the colon with more solid form stool noted within the sigmoid colon. In this region, there is relatively long segment circumferential bowel wall thickening and pericolonic inflammatory stranding suggesting changes of stercoral colitis. 2. 3.9 cm heterogeneously enhancing mass within the left adnexa, indeterminate. This may represent an exophytic or broad ligament fibroid, a primary ovarian mass, or potentially an acutely torsed left ovary. Correlation with dedicated pelvic sonography is recommended for further evaluation. 3. Mild hepatic steatosis. 4. Aortic atherosclerosis. Aortic  Atherosclerosis (ICD10-I70.0). Electronically Signed   By: Helyn Numbers M.D.   On: 12/31/2022 19:20   (Echo, Carotid, EGD, Colonoscopy, ERCP)    Subjective:  No complaints feels great Discharge Exam: Vitals:   01/01/23 2212 01/02/23 0548  BP: 130/86 (!) 141/93  Pulse: (!) 107 89  Resp: 17 17  Temp: 99 F (37.2 C) 98.6 F (37 C)  SpO2: 98% 99%   Vitals:   01/01/23 1339 01/01/23 1745 01/01/23 2212 01/02/23 0548  BP: (!) 125/91 (!) 137/92 130/86 (!) 141/93  Pulse: 97 (!) 101 (!) 107 89  Resp: 15 16 17 17   Temp: 98.1 F (36.7 C) 100.2 F (37.9 C) 99 F (37.2 C) 98.6 F (37 C)  TempSrc: Oral Oral Oral Oral  SpO2: 100% 99% 98% 99%  Weight:      Height:        General: Pt is alert, awake, not in acute distress Cardiovascular: RRR, S1/S2 +, no rubs, no gallops Respiratory: CTA bilaterally, no wheezing, no rhonchi Abdominal: Soft, NT, ND, bowel sounds + Extremities: no edema, no cyanosis    The results of significant diagnostics from this hospitalization (including imaging, microbiology, ancillary and laboratory) are listed below for reference.     Microbiology: No results found for this or any previous visit (from the past 240 hour(s)).   Labs: BNP (last 3 results) No results for input(s): "BNP" in the last 8760 hours. Basic Metabolic Panel: Recent Labs  Lab 12/31/22 1757 01/01/23 0309  NA 131* 134*  K 4.2 3.9  CL 95* 99  CO2 25 24  GLUCOSE 113* 104*  BUN 11 11  CREATININE 0.81 0.68  CALCIUM 9.2 8.8*   Liver Function Tests: Recent Labs  Lab 12/31/22 1757  AST 14*  ALT 14  ALKPHOS 62  BILITOT 1.0  PROT 7.9  ALBUMIN 4.1   Recent Labs  Lab 12/31/22 1757  LIPASE 28   No results for input(s): "AMMONIA" in the last 168 hours. CBC: Recent Labs  Lab 12/31/22 1757 01/01/23 0309  WBC 12.2* 12.1*  NEUTROABS 10.2*  --   HGB 13.8 12.5  HCT 40.6 36.8  MCV 96.9 97.1  PLT 235 218   Cardiac Enzymes: No results for input(s): "CKTOTAL", "CKMB",  "CKMBINDEX", "TROPONINI" in the last 168 hours. BNP: Invalid input(s): "POCBNP" CBG: No results for input(s): "GLUCAP" in the last 168 hours. D-Dimer No results for input(s): "DDIMER" in the last 72 hours. Hgb A1c No results for input(s): "HGBA1C" in the last 72 hours. Lipid Profile No results for input(s): "CHOL", "HDL", "LDLCALC", "TRIG", "CHOLHDL", "LDLDIRECT" in the last 72 hours. Thyroid function studies Recent Labs    01/01/23 0309  TSH 0.922   Anemia work up No results for input(s): "VITAMINB12", "FOLATE", "FERRITIN", "TIBC", "IRON", "RETICCTPCT" in the last 72 hours. Urinalysis    Component Value Date/Time   COLORURINE YELLOW 12/31/2022 1757   APPEARANCEUR CLEAR 12/31/2022 1757   LABSPEC 1.029 12/31/2022 1757   PHURINE 5.0 12/31/2022 1757   GLUCOSEU NEGATIVE 12/31/2022 1757   HGBUR LARGE (A) 12/31/2022 1757   BILIRUBINUR NEGATIVE 12/31/2022 1757   BILIRUBINUR negative 05/06/2015 1124   KETONESUR 5 (A) 12/31/2022 1757   PROTEINUR 100 (A) 12/31/2022 1757   UROBILINOGEN 1.0 05/06/2015 1124   NITRITE NEGATIVE 12/31/2022 1757   LEUKOCYTESUR NEGATIVE 12/31/2022 1757   Sepsis Labs Recent Labs  Lab 12/31/22 1757 01/01/23 0309  WBC 12.2* 12.1*   Microbiology No results found for this or any previous visit (from the past 240  hour(s)).   SIGNED:   Marinda Elk, MD  [facility] 01/02/2023, 10:06 AM Pager   If 7PM-7AM, please contact night-coverage [email or web address] Password [email or web address]

## 2023-01-02 NOTE — TOC CM/SW Note (Signed)
Transition of Care [facility]) Screening Note  Patient Details  Name: Brandi Lin Date of Birth: 1956-06-22  [facility] of [facility]) CM/SW Contact:    Ewing Schlein, LCSW Phone Number: 01/02/2023, 10:14 AM  Transition of Care Department [facility]) has reviewed patient and no TOC needs have been identified at this time. We will continue to monitor patient advancement through interdisciplinary progression rounds. If new patient transition needs arise, please place a TOC consult.

## 2023-01-03 ENCOUNTER — Telehealth: Payer: Self-pay | Admitting: *Deleted

## 2023-01-03 LAB — CA 125: Cancer Antigen (CA) 125: 42.5 U/mL — ABNORMAL HIGH (ref 0.0–38.1)

## 2023-01-03 LAB — AFP TUMOR MARKER: AFP, Serum, Tumor Marker: 5.3 ng/mL (ref 0.0–9.2)

## 2023-01-03 NOTE — Telephone Encounter (Signed)
Brandi Lin called the office to schedule a new patient appt. With Dr. Clide Cliff. States she was in the hospital over the weekend and saw Dr. Robb Matar who referred her to Dr. Alvester Morin.   Patient was given an appointment on June 17th.at 0900. Pt instructed to arrive 15 minutes prior to her appt. For check in. Pt states she is flexible in her schedule and we could call her if an earlier appointment opens up. Pt agreed to date and time.

## 2023-01-04 LAB — INHIBIN B: Inhibin B: <7 pg/mL (ref 0.0–16.9)

## 2023-01-05 LAB — INHIBIN A: Inhibin-A: 1 pg/mL

## 2023-01-14 LAB — CEA: CEA: 7.7 ng/mL — ABNORMAL HIGH (ref 0.0–4.7)

## 2023-01-27 ENCOUNTER — Encounter: Payer: Self-pay | Admitting: Psychiatry

## 2023-01-30 ENCOUNTER — Inpatient Hospital Stay: Payer: PPO

## 2023-01-30 ENCOUNTER — Other Ambulatory Visit: Payer: Self-pay

## 2023-01-30 ENCOUNTER — Inpatient Hospital Stay: Payer: PPO | Attending: Psychiatry | Admitting: Psychiatry

## 2023-01-30 ENCOUNTER — Encounter: Payer: Self-pay | Admitting: Psychiatry

## 2023-01-30 VITALS — BP 125/80 | HR 81 | Temp 97.8°F | Resp 20 | Ht 63.58 in | Wt 117.4 lb

## 2023-01-30 DIAGNOSIS — R978 Other abnormal tumor markers: Secondary | ICD-10-CM

## 2023-01-30 DIAGNOSIS — Z809 Family history of malignant neoplasm, unspecified: Secondary | ICD-10-CM | POA: Diagnosis not present

## 2023-01-30 DIAGNOSIS — F17210 Nicotine dependence, cigarettes, uncomplicated: Secondary | ICD-10-CM | POA: Diagnosis not present

## 2023-01-30 DIAGNOSIS — N9489 Other specified conditions associated with female genital organs and menstrual cycle: Secondary | ICD-10-CM

## 2023-01-30 DIAGNOSIS — R970 Elevated carcinoembryonic antigen [CEA]: Secondary | ICD-10-CM | POA: Diagnosis not present

## 2023-01-30 DIAGNOSIS — Z79899 Other long term (current) drug therapy: Secondary | ICD-10-CM | POA: Diagnosis not present

## 2023-01-30 DIAGNOSIS — R1900 Intra-abdominal and pelvic swelling, mass and lump, unspecified site: Secondary | ICD-10-CM | POA: Diagnosis present

## 2023-01-30 DIAGNOSIS — R971 Elevated cancer antigen 125 [CA 125]: Secondary | ICD-10-CM | POA: Diagnosis not present

## 2023-01-30 DIAGNOSIS — Z801 Family history of malignant neoplasm of trachea, bronchus and lung: Secondary | ICD-10-CM | POA: Insufficient documentation

## 2023-01-30 DIAGNOSIS — K5900 Constipation, unspecified: Secondary | ICD-10-CM

## 2023-01-30 DIAGNOSIS — K529 Noninfective gastroenteritis and colitis, unspecified: Secondary | ICD-10-CM | POA: Diagnosis not present

## 2023-01-30 LAB — CEA (ACCESS): CEA (CHCC): 9.02 ng/mL — ABNORMAL HIGH (ref 0.00–5.00)

## 2023-01-30 NOTE — Progress Notes (Unsigned)
GYNECOLOGIC ONCOLOGY NEW PATIENT CONSULTATION  Date of Service: 01/30/2023 Referring Provider: Marinda Elk, MD   ASSESSMENT AND PLAN: Brandi Lin is a 67 y.o. woman with ***.  *** Repeat ca125,cea Get colnoscopy records May need gi follow-up prior to surgery Make sure she if she had mesh from plastics  Consented for bso, possible staging with hyst, lad, omentectomy, etc.  A copy of this note was sent to the patient's referring provider.  Clide Cliff, MD Gynecologic Oncology   Medical Decision Making I personally spent  TOTAL *** minutes face-to-face and non-face-to-face in the care of this patient, which includes all pre, intra, and post visit time on the date of service.  *** minutes spent reviewing records prior to the visit *** Minutes in patient contact      *** minutes in other billable services *** minutes charting , conferring with consultants etc.   ------------  CC: {GynOnc Types:[phone]}  HISTORY OF PRESENT ILLNESS:  Brandi Lin is a 67 y.o. woman who is seen in consultation at the request of Marinda Elk, MD for evaluation of ***.  Patient was recently admitted to the hospital from 12/31/2022 to 01/02/2023 for constipation and colitis.  She was treated with antibiotics, Fleet enemas, MiraLAX and finally had success with a smog enema.  She was incidentally found on CT scan to have a 3.9 cm left ovary with an ultrasound showing a solid-appearing 3 cm mass.  She had no other concerning intra-abdominal findings, no lymphadenopathy.  Her CA125 was noted to be 42.5.  She had a normal AFP, inhibin A, and inhibin B.  CEA was also elevated at 7.7.  However, these labs were all collected in the setting of acute colitis.  Today *** Continuing to have regular bowel movements but they are getting hard in the past 2 days Not a problem in the past Does not have follow-up. Dr. Valentina Lucks, just retired, don't know her new PCP. No follow-up Using  metamucil, but not using mirlax, doesn't feel like the metamucil does enough  The patient denies abdominal bloating, early satiety,, change in bladder habits.  Weight loss 4lb for 1 week prior to hospitalization.not gained back. Attributed to less input due to constipation.  Dr. R[facility]?  TREATMENT HISTORY: Oncology History   No history exists.    PAST MEDICAL HISTORY: History reviewed. No pertinent past medical history.  PAST SURGICAL HISTORY: Past Surgical History:  Procedure Laterality Date   COLON SURGERY     COSMETIC SURGERY      OB/GYN HISTORY: OB History  No obstetric history on file.      Age at menarche: 38 Age at menopause: 61 Hx of HRT: no Hx of STI: no Last pap: 02/05/20 NILM History of abnormal pap smears: no  SCREENING STUDIES:  Last mammogram: 2023 Last colonoscopy: 2022***  MEDICATIONS:  Current Outpatient Medications:    ascorbic acid (VITAMIN C) 500 MG tablet, Take 500 mg by mouth daily., Disp: , Rfl:    buPROPion (WELLBUTRIN XL) 150 MG 24 hr tablet, Take 150 mg by mouth every morning., Disp: , Rfl:    cholecalciferol (VITAMIN D3) 25 MCG (1000 UNIT) tablet, Take 1,000 Units by mouth daily., Disp: , Rfl:    CITRULLINE PO, Take 1 tablet by mouth daily., Disp: , Rfl:    Multiple Vitamin (MULTIVITAMIN WITH MINERALS) TABS tablet, Take 1 tablet by mouth daily., Disp: , Rfl:    Omega-3 Fatty Acids (FISH OIL) 1000 MG  CAPS, Take 1 capsule by mouth daily., Disp: , Rfl:    polyethylene glycol (MIRALAX / GLYCOLAX) 17 g packet, Take 17 g by mouth daily as needed for moderate constipation. (Patient not taking: Reported on 01/27/2023), Disp: 14 each, Rfl: 0  ALLERGIES: No Known Allergies  FAMILY HISTORY: Family History  Problem Relation Age of Onset   Cancer Mother    Cancer Maternal Grandmother    Cancer Maternal Grandfather    Cancer Paternal Grandfather    Breast cancer Neg Hx    Pancreatic cancer Neg Hx    Ovarian cancer Neg  Hx    Endometrial cancer Neg Hx    Prostate cancer Neg Hx     SOCIAL HISTORY: Social History   Socioeconomic History   Marital status: Married    Spouse name: Not on file   Number of children: Not on file   Years of education: Not on file   Highest education level: Not on file  Occupational History   Not on file  Tobacco Use   Smoking status: Every Day    Types: Cigarettes   Smokeless tobacco: Not on file   Tobacco comments:    2 packs weekly   Vaping Use   Vaping Use: Never used  Substance and Sexual Activity   Alcohol use: Yes    Alcohol/week: 6.0 standard drinks of alcohol    Types: 6 Standard drinks or equivalent per week   Drug use: No   Sexual activity: Yes  Other Topics Concern   Not on file  Social History Narrative   Not on file   Social Determinants of Health   Financial Resource Strain: Not on file  Food Insecurity: No Food Insecurity (12/31/2022)   Hunger Vital Sign    Worried About Running Out of Food in the Last Year: Never true    Ran Out of Food in the Last Year: Never true  Transportation Needs: No Transportation Needs (12/31/2022)   PRAPARE - Administrator, Civil Service (Medical): No    Lack of Transportation (Non-Medical): No  Physical Activity: Not on file  Stress: Not on file  Social Connections: Not on file  Intimate Partner Violence: Not At Risk (12/31/2022)   Humiliation, Afraid, Rape, and Kick questionnaire    Fear of Current or Ex-Partner: No    Emotionally Abused: No    Physically Abused: No    Sexually Abused: No    REVIEW OF SYSTEMS: New patient intake form was reviewed.  Complete 10-system review is negative except for the following: constipation  PHYSICAL EXAM: There were no vitals taken for this visit. Constitutional: No acute distress. Neuro/Psych: Alert, oriented.  Head and Neck: Normocephalic, atraumatic. Neck symmetric without masses. Sclera anicteric.  Respiratory: Normal work of breathing. Clear to  auscultation bilaterally. Cardiovascular: Regular rate and rhythm, no murmurs, rubs, or gallops. Abdomen: Normoactive bowel sounds. Soft, non-distended, non-tender to palpation. No masses or hepatosplenomegaly appreciated. Well healed abdominoplasty and pfannenstiel incisions. Extremities: Grossly normal range of motion. Warm, well perfused. No edema bilaterally. Skin: No rashes or lesions. Lymphatic: No cervical, supraclavicular, or inguinal adenopathy. Genitourinary: External genitalia without lesions. Urethral meatus without lesions or prolapse. On speculum exam, vagina and cervix without lesions. Bimanual exam reveals normal cervix and small uterus, small fullness in left adnexa. Rectovaginal exam stool in vault. No nodularity or mass palpated but limited by stool. Exam chaperoned by Warner Mccreedy, NP   LABORATORY AND RADIOLOGIC DATA: ***Outside medical records were reviewed to synthesize the above  history, along with the history and physical obtained during the visit.  Outside ***laboratory, pathology, and imaging reports were reviewed, with pertinent results below.  ***I personally reviewed the outside images.  WBC  Date Value Ref Range Status  01/01/2023 12.1 (H) 4.0 - 10.5 K/uL Final   Hemoglobin  Date Value Ref Range Status  01/01/2023 12.5 12.0 - 15.0 g/dL Final   HCT  Date Value Ref Range Status  01/01/2023 36.8 36.0 - 46.0 % Final   Platelets  Date Value Ref Range Status  01/01/2023 218 150 - 400 K/uL Final   Creatinine, Ser  Date Value Ref Range Status  01/01/2023 0.68 0.44 - 1.00 mg/dL Final   AST  Date Value Ref Range Status  12/31/2022 14 (L) 15 - 41 U/L Final   ALT  Date Value Ref Range Status  12/31/2022 14 0 - 44 U/L Final   Cancer Antigen (CA) 125  Date Value Ref Range Status  01/02/2023 42.5 (H) 0.0 - 38.1 U/mL Final    Comment:    (NOTE) Roche [employer]ostics Electrochemiluminescence Immunoassay (ECLIA) Values obtained with different assay methods or  kits cannot be used interchangeably.  Results cannot be interpreted as absolute evidence of the presence or absence of malignant disease. Performed At: [facility] [address] [place], [place] [id number] Jolene Schimke MD WU:[id number]    Diagnosis  Date Value Ref Range Status  02/05/2020   Final   - Negative for intraepithelial lesion or malignancy (NILM)   Lab Results  Component Value Date   CAN125 42.5 (H) 01/02/2023   CEA1 7.7 (H) 01/02/2023   INHBB <7.0 01/02/2023   AFPTUMOR 5.3 01/02/2023  InhibinA: 1.0  US PELVIC COMPLETE W TRANSVAGINAL AND TORSION R/O 01/01/2023  Narrative CLINICAL DATA:  67 year old female with history of left lower quadrant abdominal pain. Mass in the left adnexal region noted on recent CT examination.  EXAM: TRANSABDOMINAL AND TRANSVAGINAL ULTRASOUND OF PELVIS  DOPPLER ULTRASOUND OF OVARIES  TECHNIQUE: Both transabdominal and transvaginal ultrasound examinations of the pelvis were performed. Transabdominal technique was performed for global imaging of the pelvis including uterus, ovaries, adnexal regions, and pelvic cul-de-sac.  It was necessary to proceed with endovaginal exam following the transabdominal exam to visualize the adnexal regions. Color and duplex Doppler ultrasound was utilized to evaluate blood flow to the ovaries.  COMPARISON:  No prior pelvic ultrasound. CT of the abdomen and pelvis 12/31/2022.  FINDINGS: Uterus  Measurements: 2.1 x 1.8 x 2.9 cm = volume: 5.7 mL. No fibroids or other mass visualized.  Endometrium  Thickness: 6.0 mm. Small hypoechoic regions are noted in the region of the endometrium measuring up to 7 x 6 x 6 mm, potentially small endometrial cysts.  Right ovary  Measurements: 2.1 x 1.8 x 2.9 cm = volume: 5.7 mL. Normal appearance/no adnexal mass.  Left ovary  Measurements: 3.3 x 2.9 x 2.2 cm = volume: 11.1 mL. Heterogeneously echogenic lesion in the left ovary with some  posterior acoustic shadowing measuring up to 2.8 x 2.9 x 3.1 cm with heterogeneous internal blood flow.  Pulsed Doppler evaluation of both ovaries demonstrates normal low-resistance arterial and venous waveforms.  Other findings  No abnormal free fluid.  IMPRESSION: 1. No acute findings. 2. 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 Outpatient referral to OB-GYN is recommended for further clinical evaluation and follow-up.   Electronically Signed By: Trudie Reed M.D. On: 01/01/2023 11:23   CT ABDOMEN PELVIS W CONTRAST 12/31/2022  Narrative CLINICAL  DATA:  Lower abdominal pain  EXAM: CT ABDOMEN AND PELVIS WITH CONTRAST  TECHNIQUE: Multidetector CT imaging of the abdomen and pelvis was performed using the standard protocol following bolus administration of intravenous contrast.  RADIATION DOSE REDUCTION: This exam was performed according to the departmental dose-optimization program which includes automated exposure control, adjustment of the mA and/or kV according to patient size and/or use of iterative reconstruction technique.  CONTRAST:  OMNIPAQUE IOHEXOL 300 MG/ML  SOLN  COMPARISON:  None Available.  FINDINGS: Lower chest: No acute abnormality.  Hepatobiliary: Ill-defined hypodensity within the right hepatic lobe, axial image # [id number], series # 2 is indeterminate, possibly representing a tiny cyst or hemangioma in a patient without a history of malignancy. Mild hepatic steatosis. No enhancing intrahepatic mass. No intra or extrahepatic biliary ductal dilation. Gallbladder unremarkable.  Pancreas: Unremarkable  Spleen: Unremarkable  Adrenals/Urinary Tract: The adrenal glands are unremarkable. The kidneys are normal in size and position. Multiple simple cortical cysts are seen within the upper pole the left kidney for which no follow-up imaging is recommended. The kidneys are otherwise unremarkable. The bladder is  unremarkable  Stomach/Bowel: There is large volume stool seen throughout the colon with more solid form stool noted within the sigmoid colon. In this region, there is relatively long segment circumferential bowel wall thickening and pericolonic inflammatory stranding suggesting changes of stercoral colitis. There is superimposed moderate sigmoid diverticulosis. The stomach, small bowel, and large bowel are otherwise unremarkable. No free intraperitoneal gas or fluid. No loculated intra-abdominal fluid collections.  Vascular/Lymphatic: Aortic atherosclerosis. No enlarged abdominal or pelvic lymph nodes.  Reproductive: A heterogeneously enhancing mass is seen within the left adnexa measuring 2.9 x 3.4 x 3.9 cm which is indeterminate, possibly representing an exophytic or broad ligament fibroid, a primary ovarian mass, or potentially an acutely torsed left ovary. The uterus is unremarkable. No adnexal masses seen on the right.  Other: No abdominal wall hernia or abnormality. No abdominopelvic ascites.  Musculoskeletal: Osseous structures are age-appropriate. No acute bone abnormality. No lytic or blastic bone lesion.  IMPRESSION: 1. Large volume stool seen throughout the colon with more solid form stool noted within the sigmoid colon. In this region, there is relatively long segment circumferential bowel wall thickening and pericolonic inflammatory stranding suggesting changes of stercoral colitis. 2. 3.9 cm heterogeneously enhancing mass within the left adnexa, indeterminate. This may represent an exophytic or broad ligament fibroid, a primary ovarian mass, or potentially an acutely torsed left ovary. Correlation with dedicated pelvic sonography is recommended for further evaluation. 3. Mild hepatic steatosis. 4. Aortic atherosclerosis.  Aortic Atherosclerosis (ICD10-I70.0).   Electronically Signed By: Helyn Numbers M.D. On: 12/31/2022 19:20

## 2023-01-30 NOTE — Patient Instructions (Addendum)
It was a pleasure to see you in clinic today. - I would recommend starting with once a day miralax, one tablet of senna. You may find that you need to do every other day or only a half capful of miralax. Goal of soft bowel movement every day or every other day.  We will contact you with the results of your lab work from today. We will also work on obtaining records from your last colonoscopy.  Thank you very much for allowing me to provide care for you today.  I appreciate your confidence in choosing our Gynecologic Oncology team at [facility].  If you have any questions about your visit today please call our office or send [place] a MyChart message and we will get back to you as soon as possible.

## 2023-02-01 ENCOUNTER — Encounter: Payer: Self-pay | Admitting: Psychiatry

## 2023-02-01 LAB — CA 125: Cancer Antigen (CA) 125: 25.7 U/mL (ref 0.0–38.1)

## 2023-02-02 ENCOUNTER — Telehealth: Payer: Self-pay | Admitting: *Deleted

## 2023-02-02 NOTE — Telephone Encounter (Signed)
Per Dr Alvester Morin fax records to [facility] for referral

## 2023-02-03 ENCOUNTER — Telehealth: Payer: Self-pay | Admitting: *Deleted

## 2023-02-03 NOTE — Telephone Encounter (Signed)
Brandi Lin left a message on the office voicemail. She wanted to let Dr. Alvester Morin know that she has a consultation appt. With [facility] on Tuesday, June 25th. At 1:30pm. And she doesn't know when she will be scheduled for a colonoscopy. Message forward to Warner Mccreedy, NP and Dr. Alvester Morin.

## 2023-02-08 ENCOUNTER — Telehealth: Payer: Self-pay | Admitting: *Deleted

## 2023-02-08 ENCOUNTER — Encounter: Payer: Self-pay | Admitting: Psychiatry

## 2023-02-08 NOTE — Telephone Encounter (Signed)
Patient has colonoscopy on 7/1 at 1 pm with [facility]

## 2023-02-15 ENCOUNTER — Telehealth: Payer: Self-pay | Admitting: *Deleted

## 2023-02-15 NOTE — Telephone Encounter (Signed)
Spoke with Brandi Lin and appointment was made for Monday July 8 th at 0830 with Dr. Alvester Morin to discuss final surgical plan. Pt agreed to date and time and had no further concerns or questions at this time.

## 2023-02-15 NOTE — Telephone Encounter (Signed)
Spoke with Brandi Lin this morning. Patient phoned the office asking about a date for her surgery. Pt states she had her colonoscopy on Monday, July 1st. Patient advised that we would call [facility] and obtain her results of the colonoscopy and forward message to Dr. Alvester Morin and the office would call her back with next steps to schedule an appointment with Dr. Alvester Morin prior to her surgery.

## 2023-02-17 ENCOUNTER — Telehealth: Payer: Self-pay | Admitting: *Deleted

## 2023-02-17 NOTE — Telephone Encounter (Signed)
Patient needed from 7/223 to 7/8

## 2023-02-17 NOTE — Telephone Encounter (Signed)
Spoke with Ms. Arch and appointment made for a follow up with Dr. Alvester Morin to discuss surgery on Monday, July 22nd at 1:30 pm with a check in time 1:15.  Pt agreed to date and time.

## 2023-02-20 ENCOUNTER — Inpatient Hospital Stay: Payer: PPO | Attending: Psychiatry | Admitting: Psychiatry

## 2023-02-20 ENCOUNTER — Inpatient Hospital Stay (HOSPITAL_BASED_OUTPATIENT_CLINIC_OR_DEPARTMENT_OTHER): Payer: PPO | Admitting: Gynecologic Oncology

## 2023-02-20 VITALS — BP 140/90 | HR 84 | Temp 98.7°F | Resp 16 | Wt 118.0 lb

## 2023-02-20 DIAGNOSIS — F17210 Nicotine dependence, cigarettes, uncomplicated: Secondary | ICD-10-CM | POA: Insufficient documentation

## 2023-02-20 DIAGNOSIS — Z7189 Other specified counseling: Secondary | ICD-10-CM | POA: Diagnosis not present

## 2023-02-20 DIAGNOSIS — N9489 Other specified conditions associated with female genital organs and menstrual cycle: Secondary | ICD-10-CM

## 2023-02-20 DIAGNOSIS — R970 Elevated carcinoembryonic antigen [CEA]: Secondary | ICD-10-CM | POA: Diagnosis not present

## 2023-02-20 DIAGNOSIS — D398 Neoplasm of uncertain behavior of other specified female genital organs: Secondary | ICD-10-CM | POA: Insufficient documentation

## 2023-02-20 DIAGNOSIS — R978 Other abnormal tumor markers: Secondary | ICD-10-CM | POA: Diagnosis not present

## 2023-02-20 DIAGNOSIS — K529 Noninfective gastroenteritis and colitis, unspecified: Secondary | ICD-10-CM

## 2023-02-20 NOTE — Progress Notes (Signed)
Patient here for a pre-operative appointment prior to her scheduled surgery on March 07, 2023. She is scheduled for a robotic assisted laparoscopic bilateral salpingo-oophorectomy, possible staging including total hysterectomy/ omentectomy/ biopsies . The surgery was discussed in detail.  See after visit summary for additional details. Visual aids used to discuss items related to surgery including the incentive spirometer, sequential compression stockings, foley catheter, IV pump, multi-modal pain regimen including tylenol, photo of the surgical robot, female reproductive system to discuss surgery in detail.      Discussed post-op pain management in detail including the aspects of the enhanced recovery pathway.  Advised her that a new prescription would be sent in and it is only to be used for after her upcoming surgery.  We discussed the use of tylenol post-op and to monitor for a maximum of 4,000 mg in a 24 hour period.  Also prescribed sennakot to be used after surgery and to hold if having loose stools.  Discussed bowel regimen in detail.     Discussed the use of SCDs and measures to take at home to prevent DVT including frequent mobility.  Reportable signs and symptoms of DVT discussed. Post-operative instructions discussed and expectations for after surgery. Incisional care discussed as well including reportable signs and symptoms including erythema, drainage, wound separation.     30 minutes spent with the patient.  Verbalizing understanding of material discussed. No needs or concerns voiced at the end of the visit.   Advised patient to call for any needs.  Advised that her post-operative medications had been prescribed and could be picked up at any time.    This appointment is included in the global surgical bundle as pre-operative teaching and has no charge.

## 2023-02-20 NOTE — H&P (View-Only) (Signed)
Gynecologic Oncology Return Clinic Visit  Date of Service: 02/20/2023 Referring Provider: Marinda Elk, MD   Assessment & Plan: Brandi Lin is a 67 y.o. woman with an incidentally identified adnexal mass.  Reviewed her prior tumor markers.  Repeat CA125 now in normal range.  Repeat CEA mildly elevated still.  Colonoscopy completed without evidence of malignancy.  Biopsies with tubular adenoma hyperplastic polyp.  Colonoscopy also identified diverticuli.  Given overall negative GI workup, recommend proceeding with definitive surgery for evaluation of adnexal mass.  Again reviewed that this could represent a benign, malignant, or borderline process.  We also reviewed that it is possible the imaging finding will not correlate with an adnexal lesion and could be derived from some other pelvic or abdominal structure.    Recommend proceeding with robotic assisted bilateral salpingo-oophorectomy, possible staging with hysterectomy, pelvic/periotic lymph node dissection, omentectomy, biopsies.  Patient was consented for the above, scheduled for 03/07/23.  The risks of surgery were discussed in detail and she understands these to including but not limited to bleeding requiring a blood transfusion, infection, injury to adjacent organs (including but not limited to the bowels, bladder, ureters, nerves, blood vessels), thromboembolic events, wound separation, hernia, vaginal cuff separation, possible risk of lymphedema and lymphocyst if lymphadenectomy performed, unforseen complication, and possible need for re-exploration.  Reviewed that her recent hospitalization for colitis and her evidence of diverticuli on colonoscopy could increase her risk of complexity of surgery and increased risk of surgical complication.  If the patient experiences any of these events, she understands that her hospitalization or recovery may be prolonged and that she may need to take additional medications for a prolonged  period. The patient will receive DVT and antibiotic prophylaxis as indicated. She voiced a clear understanding. She had the opportunity to ask questions and informed consent was obtained today. She wishes to proceed.  She does not require preoperative clearance. Her METs are >4.  All preoperative instructions were reviewed. Postoperative expectations were also reviewed. Written handouts were provided to the patient.  RTC postop.  Clide Cliff, MD Gynecologic Oncology   Medical Decision Making I personally spent  TOTAL 30 minutes face-to-face and non-face-to-face in the care of this patient, which includes all pre, intra, and post visit time on the date of service.   ----------------------- Reason for Visit: Follow/treatment discussion  Interval History: Since her last visit, patient underwent a colonoscopy in 02/13/2023 with two biopsies performed resulting with a tubular adenoma and a hyperplastic polyp. She was also noted to have several diverticuli on the colonoscopy.  Today, patient presents with her husband.  She reports daily soft bowel movement since her colonoscopy.  She was using senna but is now switched to MiraLAX.  Otherwise, no new concerns today.   Past Medical/Surgical History: Past Medical History:  Diagnosis Date   Colitis     Past Surgical History:  Procedure Laterality Date   ABDOMINOPLASTY     CESAREAN SECTION     COLONOSCOPY W/ POLYPECTOMY  1991    Family History  Problem Relation Age of Onset   Cancer Mother        Blood cancer   Cancer Maternal Grandmother        unknown type   Lung cancer Maternal Grandfather    Lung cancer Paternal Grandfather    Cancer Maternal Uncle        blood cancer   Cancer Maternal Uncle        blood cancer   Breast cancer Neg  Hx    Pancreatic cancer Neg Hx    Ovarian cancer Neg Hx    Endometrial cancer Neg Hx    Prostate cancer Neg Hx     Social History   Socioeconomic History   Marital status: Married     Spouse name: Not on file   Number of children: Not on file   Years of education: Not on file   Highest education level: Not on file  Occupational History   Not on file  Tobacco Use   Smoking status: Every Day    Packs/day: 0.10    Years: 45.00    Additional pack years: 0.00    Total pack years: 4.50    Types: Cigarettes   Smokeless tobacco: Not on file   Tobacco comments:    2 packs weekly   Vaping Use   Vaping Use: Never used  Substance and Sexual Activity   Alcohol use: Yes    Alcohol/week: 6.0 standard drinks of alcohol    Types: 6 Standard drinks or equivalent per week   Drug use: No   Sexual activity: Yes  Other Topics Concern   Not on file  Social History Narrative   Not on file   Social Determinants of Health   Financial Resource Strain: Not on file  Food Insecurity: No Food Insecurity (12/31/2022)   Hunger Vital Sign    Worried About Running Out of Food in the Last Year: Never true    Ran Out of Food in the Last Year: Never true  Transportation Needs: No Transportation Needs (12/31/2022)   PRAPARE - Administrator, Civil Service (Medical): No    Lack of Transportation (Non-Medical): No  Physical Activity: Not on file  Stress: Not on file  Social Connections: Not on file    Current Medications:  Current Outpatient Medications:    ascorbic acid (VITAMIN C) 500 MG tablet, Take 500 mg by mouth daily., Disp: , Rfl:    buPROPion (WELLBUTRIN XL) 150 MG 24 hr tablet, Take 150 mg by mouth every morning., Disp: , Rfl:    cholecalciferol (VITAMIN D3) 25 MCG (1000 UNIT) tablet, Take 1,000 Units by mouth daily., Disp: , Rfl:    CITRULLINE PO, Take 1 tablet by mouth daily., Disp: , Rfl:    Multiple Vitamin (MULTIVITAMIN WITH MINERALS) TABS tablet, Take 1 tablet by mouth daily., Disp: , Rfl:    Omega-3 Fatty Acids (FISH OIL) 1000 MG CAPS, Take 1 capsule by mouth daily., Disp: , Rfl:    polyethylene glycol (MIRALAX / GLYCOLAX) 17 g packet, Take 17 g by mouth  daily as needed for moderate constipation. (Patient not taking: Reported on 01/27/2023), Disp: 14 each, Rfl: 0  Review of Symptoms: Complete 10-system review is negative except as above in Interval History.  Physical Exam: BP (!) 140/90 (BP Location: Left Arm, Patient Position: Sitting)   Pulse 84   Temp 98.7 F (37.1 C) (Oral)   Resp 16   Wt 118 lb (53.5 kg)   SpO2 99%   BMI 20.52 kg/m  General: Alert, oriented, no acute distress. HEENT: Normocephalic, atraumatic. Neck symmetric without masses. Sclera anicteric.  Chest: Normal work of breathing. Clear to auscultation bilaterally.   Cardiovascular: Regular rate and rhythm, no murmurs. Abdomen: Soft, nontender.  Normoactive bowel sounds. Extremities: Grossly normal range of motion.  Warm, well perfused.  No edema bilaterally. Skin: No rashes or lesions noted.  Laboratory & Radiologic Studies: Lab Results  Component Value Date   CAN125 25.7  01/30/2023   CAN125 42.5 (H) 01/02/2023   CEA 9.02 (H) 01/30/2023   CEA1 7.7 (H) 01/02/2023   INHBB <7.0 01/02/2023   AFPTUMOR 5.3 01/02/2023

## 2023-02-20 NOTE — Patient Instructions (Signed)
Preparing for your Surgery  Plan for surgery on March 07, 2023 with Dr. Clide Cliff at [facility]. You will be scheduled for robotic assisted laparoscopic bilateral salpingo-oophorectomy (removal of both ovaries and fallopian tubes), possible staging including total hysterectomy/ omentectomy/ biopsies.  Pre-operative Testing -You will receive a phone call from presurgical testing at [facility] to arrange for a pre-operative appointment and lab work.  -Bring your insurance card, copy of an advanced directive if applicable, medication list  -At that visit, you will be asked to sign a consent for a possible blood transfusion in case a transfusion becomes necessary during surgery.  The need for a blood transfusion is rare but having consent is a necessary part of your care.     -You should not be taking blood thinners or aspirin at least ten days prior to surgery unless instructed by your surgeon.  -Do not take supplements such as fish oil (omega 3), red yeast rice, turmeric before your surgery. You want to avoid medications with aspirin in them including headache powders such as BC or Goody's), Excedrin migraine. STOP FISH OIL NOW.  Day Before Surgery at Home -You will be asked to take in a light diet the day before surgery. You will be advised you can have clear liquids up until 3 hours before your surgery.    Eat a light diet the day before surgery.  Examples including soups, broths, toast, yogurt, mashed potatoes.  AVOID GAS PRODUCING FOODS AND BEVERAGES. Things to avoid include carbonated beverages (fizzy beverages, sodas), raw fruits and raw vegetables (uncooked), or beans.   If your bowels are filled with gas, your surgeon will have difficulty visualizing your pelvic organs which increases your surgical risks.  Your role in recovery Your role is to become active as soon as directed by your doctor, while still giving yourself time to heal.  Rest when you feel tired.  You will be asked to do the following in order to speed your recovery:  - Cough and breathe deeply. This helps to clear and expand your lungs and can prevent pneumonia after surgery.  - STAY ACTIVE WHEN YOU GET HOME. Do mild physical activity. Walking or moving your legs help your circulation and body functions return to normal. Do not try to get up or walk alone the first time after surgery.   -If you develop swelling on one leg or the other, pain in the back of your leg, redness/warmth in one of your legs, please call the office or go to the Emergency Room to have a doppler to rule out a blood clot. For shortness of breath, chest pain-seek care in the Emergency Room as soon as possible. - Actively manage your pain. Managing your pain lets you move in comfort. We will ask you to rate your pain on a scale of zero to 10. It is your responsibility to tell your doctor or nurse where and how much you hurt so your pain can be treated.  Special Considerations -If you are diabetic, you may be placed on insulin after surgery to have closer control over your blood sugars to promote healing and recovery.  This does not mean that you will be discharged on insulin.  If applicable, your oral antidiabetics will be resumed when you are tolerating a solid diet.  -Your final pathology results from surgery should be available around one week after surgery and the results will be relayed to you when available.  -Dr. Antionette Char is the surgeon  that assists your GYN Oncologist with surgery.  If you end up staying the night, the next day after your surgery you will either see Dr. Pricilla Holm, Dr. Alvester Morin, or Dr. Antionette Char.  -FMLA forms can be faxed to [phone] and please allow 5-7 business days for completion.  Pain Management After Surgery -You have been prescribed your pain medication and bowel regimen medications before surgery so that you can have these available when you are discharged from the  hospital. The pain medication is for use ONLY AFTER surgery and a new prescription will not be given.   -Make sure that you have Tylenol and Ibuprofen IF YOU ARE ABLE TO TAKE THESE MEDICATIONS at home to use on a regular basis after surgery for pain control. We recommend alternating the medications every hour to six hours since they work differently and are processed in the body differently for pain relief.  -Review the attached handout on narcotic use and their risks and side effects.   Bowel Regimen -You have been prescribed Sennakot-S to take nightly to prevent constipation especially if you are taking the narcotic pain medication intermittently.  It is important to prevent constipation and drink adequate amounts of liquids. You can stop taking this medication when you are not taking pain medication and you are back on your normal bowel routine.  Risks of Surgery Risks of surgery are low but include bleeding, infection, damage to surrounding structures, re-operation, blood clots, and very rarely death.   Blood Transfusion Information (For the consent to be signed before surgery)  We will be checking your blood type before surgery so in case of emergencies, we will know what type of blood you would need.                                            WHAT IS A BLOOD TRANSFUSION?  A transfusion is the replacement of blood or some of its parts. Blood is made up of multiple cells which provide different functions. Red blood cells carry oxygen and are used for blood loss replacement. White blood cells fight against infection. Platelets control bleeding. Plasma helps clot blood. Other blood products are available for specialized needs, such as hemophilia or other clotting disorders. BEFORE THE TRANSFUSION  Who gives blood for transfusions?  You may be able to donate blood to be used at a later date on yourself (autologous donation). Relatives can be asked to donate blood. This is generally not  any safer than if you have received blood from a stranger. The same precautions are taken to ensure safety when a relative's blood is donated. Healthy volunteers who are fully evaluated to make sure their blood is safe. This is blood bank blood. Transfusion therapy is the safest it has ever been in the practice of medicine. Before blood is taken from a donor, a complete history is taken to make sure that person has no history of diseases nor engages in risky social behavior (examples are intravenous drug use or sexual activity with multiple partners). The donor's travel history is screened to minimize risk of transmitting infections, such as malaria. The donated blood is tested for signs of infectious diseases, such as HIV and hepatitis. The blood is then tested to be sure it is compatible with you in order to minimize the chance of a transfusion reaction. If you or a relative donates blood, this is  often done in anticipation of surgery and is not appropriate for emergency situations. It takes many days to process the donated blood. RISKS AND COMPLICATIONS Although transfusion therapy is very safe and saves many lives, the main dangers of transfusion include:  Getting an infectious disease. Developing a transfusion reaction. This is an allergic reaction to something in the blood you were given. Every precaution is taken to prevent this. The decision to have a blood transfusion has been considered carefully by your caregiver before blood is given. Blood is not given unless the benefits outweigh the risks.  AFTER SURGERY INSTRUCTIONS  Return to work: 4-6 weeks if applicable  Activity: 1. Be up and out of the bed during the day.  Take a nap if needed.  You may walk up steps but be careful and use the hand rail.  Stair climbing will tire you more than you think, you may need to stop part way and rest.   2. No lifting or straining for 6 weeks over 10 pounds. No pushing, pulling, straining for 6  weeks.  3. No driving for around 1 week(s).  Do not drive if you are taking narcotic pain medicine and make sure that your reaction time has returned.   4. You can shower as soon as the next day after surgery. Shower daily.  Use your regular soap and water (not directly on the incision) and pat your incision(s) dry afterwards; don't rub.  No tub baths or submerging your body in water until cleared by your surgeon. If you have the soap that was given to you by pre-surgical testing that was used before surgery, you do not need to use it afterwards because this can irritate your incisions.   5. No sexual activity and nothing in the vagina for 4-6 weeks, 12 weeks if you have a hysterectomy.  6. You may experience a small amount of clear drainage from your incisions, which is normal.  If the drainage persists, increases, or changes color please call the office.  7. Do not use creams, lotions, or ointments such as neosporin on your incisions after surgery until advised by your surgeon because they can cause removal of the dermabond glue on your incisions.    8. You may experience vaginal spotting after surgery or around the 6-8 week mark from surgery when the stitches at the top of the vagina begin to dissolve.  The spotting is normal but if you experience heavy bleeding, call our office.  9. Take Tylenol or ibuprofen first for pain if you are able to take these medications and only use narcotic pain medication for severe pain not relieved by the Tylenol or Ibuprofen.  Monitor your Tylenol intake to a max of 4,000 mg in a 24 hour period. You can alternate these medications after surgery.  Diet: 1. Low sodium Heart Healthy Diet is recommended but you are cleared to resume your normal (before surgery) diet after your procedure.  2. It is safe to use a laxative, such as Miralax or Colace, if you have difficulty moving your bowels. You have been prescribed Sennakot-S to take at bedtime every evening after  surgery to keep bowel movements regular and to prevent constipation.    Wound Care: 1. Keep clean and dry.  Shower daily.  Reasons to call the Doctor: Fever - Oral temperature greater than 100.4 degrees Fahrenheit Foul-smelling vaginal discharge Difficulty urinating Nausea and vomiting Increased pain at the site of the incision that is unrelieved with pain medicine. Difficulty breathing  with or without chest pain New calf pain especially if only on one side Sudden, continuing increased vaginal bleeding with or without clots.   Contacts: For questions or concerns you should contact:  Dr. Clide Cliff at [phone]  Warner Mccreedy, NP at [phone]  After Hours: call [phone] and have the GYN Oncologist paged/contacted (after 5 pm or on the weekends). You will speak with an after hours RN and let he or she know you have had surgery.  Messages sent via mychart are for non-urgent matters and are not responded to after hours so for urgent needs, please call the after hours number.

## 2023-02-20 NOTE — Progress Notes (Signed)
Gynecologic Oncology Return Clinic Visit  Date of Service: 02/20/2023 Referring Provider: Marinda Elk, MD   Assessment & Plan: Brandi Lin is a 67 y.o. woman with an incidentally identified adnexal mass.  Reviewed her prior tumor markers.  Repeat CA125 now in normal range.  Repeat CEA mildly elevated still.  Colonoscopy completed without evidence of malignancy.  Biopsies with tubular adenoma hyperplastic polyp.  Colonoscopy also identified diverticuli.  Given overall negative GI workup, recommend proceeding with definitive surgery for evaluation of adnexal mass.  Again reviewed that this could represent a benign, malignant, or borderline process.  We also reviewed that it is possible the imaging finding will not correlate with an adnexal lesion and could be derived from some other pelvic or abdominal structure.    Recommend proceeding with robotic assisted bilateral salpingo-oophorectomy, possible staging with hysterectomy, pelvic/periotic lymph node dissection, omentectomy, biopsies.  Patient was consented for the above, scheduled for 03/07/23.  The risks of surgery were discussed in detail and she understands these to including but not limited to bleeding requiring a blood transfusion, infection, injury to adjacent organs (including but not limited to the bowels, bladder, ureters, nerves, blood vessels), thromboembolic events, wound separation, hernia, vaginal cuff separation, possible risk of lymphedema and lymphocyst if lymphadenectomy performed, unforseen complication, and possible need for re-exploration.  Reviewed that her recent hospitalization for colitis and her evidence of diverticuli on colonoscopy could increase her risk of complexity of surgery and increased risk of surgical complication.  If the patient experiences any of these events, she understands that her hospitalization or recovery may be prolonged and that she may need to take additional medications for a prolonged  period. The patient will receive DVT and antibiotic prophylaxis as indicated. She voiced a clear understanding. She had the opportunity to ask questions and informed consent was obtained today. She wishes to proceed.  She does not require preoperative clearance. Her METs are >4.  All preoperative instructions were reviewed. Postoperative expectations were also reviewed. Written handouts were provided to the patient.  RTC postop.  Clide Cliff, MD Gynecologic Oncology   Medical Decision Making I personally spent  TOTAL 30 minutes face-to-face and non-face-to-face in the care of this patient, which includes all pre, intra, and post visit time on the date of service.   ----------------------- Reason for Visit: Follow/treatment discussion  Interval History: Since her last visit, patient underwent a colonoscopy in 02/13/2023 with two biopsies performed resulting with a tubular adenoma and a hyperplastic polyp. She was also noted to have several diverticuli on the colonoscopy.  Today, patient presents with her husband.  She reports daily soft bowel movement since her colonoscopy.  She was using senna but is now switched to MiraLAX.  Otherwise, no new concerns today.   Past Medical/Surgical History: Past Medical History:  Diagnosis Date   Colitis     Past Surgical History:  Procedure Laterality Date   ABDOMINOPLASTY     CESAREAN SECTION     COLONOSCOPY W/ POLYPECTOMY  1991    Family History  Problem Relation Age of Onset   Cancer Mother        Blood cancer   Cancer Maternal Grandmother        unknown type   Lung cancer Maternal Grandfather    Lung cancer Paternal Grandfather    Cancer Maternal Uncle        blood cancer   Cancer Maternal Uncle        blood cancer   Breast cancer Neg  Hx    Pancreatic cancer Neg Hx    Ovarian cancer Neg Hx    Endometrial cancer Neg Hx    Prostate cancer Neg Hx     Social History   Socioeconomic History   Marital status: Married     Spouse name: Not on file   Number of children: Not on file   Years of education: Not on file   Highest education level: Not on file  Occupational History   Not on file  Tobacco Use   Smoking status: Every Day    Packs/day: 0.10    Years: 45.00    Additional pack years: 0.00    Total pack years: 4.50    Types: Cigarettes   Smokeless tobacco: Not on file   Tobacco comments:    2 packs weekly   Vaping Use   Vaping Use: Never used  Substance and Sexual Activity   Alcohol use: Yes    Alcohol/week: 6.0 standard drinks of alcohol    Types: 6 Standard drinks or equivalent per week   Drug use: No   Sexual activity: Yes  Other Topics Concern   Not on file  Social History Narrative   Not on file   Social Determinants of Health   Financial Resource Strain: Not on file  Food Insecurity: No Food Insecurity (12/31/2022)   Hunger Vital Sign    Worried About Running Out of Food in the Last Year: Never true    Ran Out of Food in the Last Year: Never true  Transportation Needs: No Transportation Needs (12/31/2022)   PRAPARE - Administrator, Civil Service (Medical): No    Lack of Transportation (Non-Medical): No  Physical Activity: Not on file  Stress: Not on file  Social Connections: Not on file    Current Medications:  Current Outpatient Medications:    ascorbic acid (VITAMIN C) 500 MG tablet, Take 500 mg by mouth daily., Disp: , Rfl:    buPROPion (WELLBUTRIN XL) 150 MG 24 hr tablet, Take 150 mg by mouth every morning., Disp: , Rfl:    cholecalciferol (VITAMIN D3) 25 MCG (1000 UNIT) tablet, Take 1,000 Units by mouth daily., Disp: , Rfl:    CITRULLINE PO, Take 1 tablet by mouth daily., Disp: , Rfl:    Multiple Vitamin (MULTIVITAMIN WITH MINERALS) TABS tablet, Take 1 tablet by mouth daily., Disp: , Rfl:    Omega-3 Fatty Acids (FISH OIL) 1000 MG CAPS, Take 1 capsule by mouth daily., Disp: , Rfl:    polyethylene glycol (MIRALAX / GLYCOLAX) 17 g packet, Take 17 g by mouth  daily as needed for moderate constipation. (Patient not taking: Reported on 01/27/2023), Disp: 14 each, Rfl: 0  Review of Symptoms: Complete 10-system review is negative except as above in Interval History.  Physical Exam: BP (!) 140/90 (BP Location: Left Arm, Patient Position: Sitting)   Pulse 84   Temp 98.7 F (37.1 C) (Oral)   Resp 16   Wt 118 lb (53.5 kg)   SpO2 99%   BMI 20.52 kg/m  General: Alert, oriented, no acute distress. HEENT: Normocephalic, atraumatic. Neck symmetric without masses. Sclera anicteric.  Chest: Normal work of breathing. Clear to auscultation bilaterally.   Cardiovascular: Regular rate and rhythm, no murmurs. Abdomen: Soft, nontender.  Normoactive bowel sounds. Extremities: Grossly normal range of motion.  Warm, well perfused.  No edema bilaterally. Skin: No rashes or lesions noted.  Laboratory & Radiologic Studies: Lab Results  Component Value Date   CAN125 25.7  01/30/2023   CAN125 42.5 (H) 01/02/2023   CEA 9.02 (H) 01/30/2023   CEA1 7.7 (H) 01/02/2023   INHBB <7.0 01/02/2023   AFPTUMOR 5.3 01/02/2023

## 2023-02-22 ENCOUNTER — Encounter: Payer: Self-pay | Admitting: Psychiatry

## 2023-02-27 ENCOUNTER — Encounter: Payer: Self-pay | Admitting: Psychiatry

## 2023-02-27 ENCOUNTER — Other Ambulatory Visit: Payer: Self-pay | Admitting: Internal Medicine

## 2023-02-27 DIAGNOSIS — Z13820 Encounter for screening for osteoporosis: Secondary | ICD-10-CM

## 2023-02-28 NOTE — Patient Instructions (Addendum)
SURGICAL WAITING ROOM VISITATION Patients having surgery or a procedure may have no more than 2 support people in the waiting area - these visitors may rotate.    Children under the age of 44 must have an adult with them who is not the patient.  If the patient needs to stay at the hospital during part of their recovery, the visitor guidelines for inpatient rooms apply. Pre-op nurse will coordinate an appropriate time for 1 support person to accompany patient in pre-op.  This support person may not rotate.    Please refer to the [facility] website for the visitor guidelines for Inpatients (after your surgery is over and you are in a regular room).       Your procedure is scheduled on: 03-07-23   Report to [facility] Main Entrance    Report to admitting at 8:45 AM   Call this number if you have problems the morning of surgery [phone]   Follow a light diet the day before surgery (avoid gas producing foods)   Do not eat food :After Midnight.   After Midnight you may have the following liquids until 8:00 AM DAY OF SURGERY  Water Non-Citrus Juices (without pulp, NO RED-Apple, White grape, White cranberry) Black Coffee (NO MILK/CREAM OR CREAMERS, sugar ok)  Clear Tea (NO MILK/CREAM OR CREAMERS, sugar ok) regular and decaf                             Plain Jell-O (NO RED)                                           Fruit ices (not with fruit pulp, NO RED)                                     Popsicles (NO RED)                                                               Sports drinks like Gatorade (NO RED)                       If you have questions, please contact your surgeon's office.   FOLLOW BOWEL PREP AND ANY ADDITIONAL PRE OP INSTRUCTIONS YOU RECEIVED FROM YOUR SURGEON'S OFFICE!!!     Oral Hygiene is also important to reduce your risk of infection.                                    Remember - BRUSH YOUR TEETH THE MORNING OF SURGERY WITH YOUR REGULAR  TOOTHPASTE   Do NOT smoke after Midnight   Take these medicines the morning of surgery with A SIP OF WATER:   Bupropion                              You may not have any metal on your body including hair pins, jewelry,  and body piercing             Do not wear make-up, lotions, powders, perfumes or deodorant  Do not wear nail polish including gel and S&S, artificial/acrylic nails, or any other type of covering on natural nails including finger and toenails. If you have artificial nails, gel coating, etc. that needs to be removed by a nail salon please have this removed prior to surgery or surgery may need to be canceled/ delayed if the surgeon/ anesthesia feels like they are unable to be safely monitored.   Do not shave  48 hours prior to surgery.    Do not bring valuables to the hospital. [facility] IS NOT RESPONSIBLE   FOR VALUABLES.   Contacts, dentures or bridgework may not be worn into surgery.   DO NOT BRING YOUR HOME MEDICATIONS TO THE HOSPITAL. PHARMACY WILL DISPENSE MEDICATIONS LISTED ON YOUR MEDICATION LIST TO YOU DURING YOUR ADMISSION IN THE HOSPITAL!    Patients discharged on the day of surgery will not be allowed to drive home.  Someone NEEDS to stay with you for the first 24 hours after anesthesia.              Please read over the following fact sheets you were given: IF YOU HAVE QUESTIONS ABOUT YOUR PRE-OP INSTRUCTIONS PLEASE CALL [phone] [facility]  If you received a COVID test during your pre-op visit  it is requested that you wear a mask when out in public, stay away from anyone that may not be feeling well and notify your surgeon if you develop symptoms. If you test positive for Covid or have been in contact with anyone that has tested positive in the last 10 days please notify you surgeon.  [facility] - Preparing for Surgery Before surgery, you can play an important role.  Because skin is not sterile, your skin needs to be as free of germs as possible.  You can  reduce the number of germs on your skin by washing with CHG (chlorahexidine gluconate) soap before surgery.  CHG is an antiseptic cleaner which kills germs and bonds with the skin to continue killing germs even after washing. Please DO NOT use if you have an allergy to CHG or antibacterial soaps.  If your skin becomes reddened/irritated stop using the CHG and inform your nurse when you arrive at Short Stay. Do not shave (including legs and underarms) for at least 48 hours prior to the first CHG shower.  You may shave your face/neck.  Please follow these instructions carefully:  1.  Shower with CHG Soap the night before surgery and the  morning of surgery.  2.  If you choose to wash your hair, wash your hair first as usual with your normal  shampoo.  3.  After you shampoo, rinse your hair and body thoroughly to remove the shampoo.                             4.  Use CHG as you would any other liquid soap.  You can apply chg directly to the skin and wash.  Gently with a scrungie or clean washcloth.  5.  Apply the CHG Soap to your body ONLY FROM THE NECK DOWN.   Do   not use on face/ open                           Wound  or open sores. Avoid contact with eyes, ears mouth and   genitals (private parts).                       Wash face,  Genitals (private parts) with your normal soap.             6.  Wash thoroughly, paying special attention to the area where your    surgery  will be performed.  7.  Thoroughly rinse your body with warm water from the neck down.  8.  DO NOT shower/wash with your normal soap after using and rinsing off the CHG Soap.                9.  Pat yourself dry with a clean towel.            10.  Wear clean pajamas.            11.  Place clean sheets on your bed the night of your first shower and do not  sleep with pets. Day of Surgery : Do not apply any lotions/deodorants the morning of surgery.  Please wear clean clothes to the hospital/surgery center.  FAILURE TO FOLLOW THESE  INSTRUCTIONS MAY RESULT IN THE CANCELLATION OF YOUR SURGERY  PATIENT SIGNATURE_________________________________  NURSE SIGNATURE__________________________________  ________________________________________________________________________   WHAT IS A BLOOD TRANSFUSION? Blood Transfusion Information  A transfusion is the replacement of blood or some of its parts. Blood is made up of multiple cells which provide different functions. Red blood cells carry oxygen and are used for blood loss replacement. White blood cells fight against infection. Platelets control bleeding. Plasma helps clot blood. Other blood products are available for specialized needs, such as hemophilia or other clotting disorders. BEFORE THE TRANSFUSION  Who gives blood for transfusions?  Healthy volunteers who are fully evaluated to make sure their blood is safe. This is blood bank blood. Transfusion therapy is the safest it has ever been in the practice of medicine. Before blood is taken from a donor, a complete history is taken to make sure that person has no history of diseases nor engages in risky social behavior (examples are intravenous drug use or sexual activity with multiple partners). The donor's travel history is screened to minimize risk of transmitting infections, such as malaria. The donated blood is tested for signs of infectious diseases, such as HIV and hepatitis. The blood is then tested to be sure it is compatible with you in order to minimize the chance of a transfusion reaction. If you or a relative donates blood, this is often done in anticipation of surgery and is not appropriate for emergency situations. It takes many days to process the donated blood. RISKS AND COMPLICATIONS Although transfusion therapy is very safe and saves many lives, the main dangers of transfusion include:  Getting an infectious disease. Developing a transfusion reaction. This is an allergic reaction to something in the blood you  were given. Every precaution is taken to prevent this. The decision to have a blood transfusion has been considered carefully by your caregiver before blood is given. Blood is not given unless the benefits outweigh the risks. AFTER THE TRANSFUSION Right after receiving a blood transfusion, you will usually feel much better and more energetic. This is especially true if your red blood cells have gotten low (anemic). The transfusion raises the level of the red blood cells which carry oxygen, and this usually causes an energy increase. The nurse administering the transfusion  will monitor you carefully for complications. HOME CARE INSTRUCTIONS  No special instructions are needed after a transfusion. You may find your energy is better. Speak with your caregiver about any limitations on activity for underlying diseases you may have. SEEK MEDICAL CARE IF:  Your condition is not improving after your transfusion. You develop redness or irritation at the intravenous (IV) site. SEEK IMMEDIATE MEDICAL CARE IF:  Any of the following symptoms occur over the next 12 hours: Shaking chills. You have a temperature by mouth above 102 F (38.9 C), not controlled by medicine. Chest, back, or muscle pain. People around you feel you are not acting correctly or are confused. Shortness of breath or difficulty breathing. Dizziness and fainting. You get a rash or develop hives. You have a decrease in urine output. Your urine turns a dark color or changes to pink, red, or brown. Any of the following symptoms occur over the next 10 days: You have a temperature by mouth above 102 F (38.9 C), not controlled by medicine. Shortness of breath. Weakness after normal activity. The white part of the eye turns yellow (jaundice). You have a decrease in the amount of urine or are urinating less often. Your urine turns a dark color or changes to pink, red, or brown. Document Released: 07/29/2000 Document Revised: 10/24/2011  Document Reviewed: 03/17/2008 [facility] Patient Information 2014 [place], [place].  _______________________________________________________________________

## 2023-03-03 ENCOUNTER — Other Ambulatory Visit: Payer: Self-pay

## 2023-03-03 ENCOUNTER — Encounter (HOSPITAL_COMMUNITY)
Admission: RE | Admit: 2023-03-03 | Discharge: 2023-03-03 | Disposition: A | Discharge status: Home / Self Care | Payer: PPO | Source: Ambulatory Visit | Attending: Psychiatry | Admitting: Psychiatry

## 2023-03-03 ENCOUNTER — Encounter (HOSPITAL_COMMUNITY): Payer: Self-pay

## 2023-03-03 DIAGNOSIS — Z01812 Encounter for preprocedural laboratory examination: Secondary | ICD-10-CM | POA: Diagnosis not present

## 2023-03-03 DIAGNOSIS — N9489 Other specified conditions associated with female genital organs and menstrual cycle: Secondary | ICD-10-CM | POA: Diagnosis not present

## 2023-03-03 LAB — COMPREHENSIVE METABOLIC PANEL
ALT: 25 U/L (ref 0–44)
AST: 23 U/L (ref 15–41)
Albumin: 4.1 g/dL (ref 3.5–5.0)
Alkaline Phosphatase: 59 U/L (ref 38–126)
Anion gap: 7 (ref 5–15)
BUN: 16 mg/dL (ref 8–23)
CO2: 26 mmol/L (ref 22–32)
Calcium: 9.3 mg/dL (ref 8.9–10.3)
Chloride: 104 mmol/L (ref 98–111)
Creatinine, Ser: 0.8 mg/dL (ref 0.44–1.00)
GFR, Estimated: >60 mL/min (ref >60)
Glucose, Bld: 79 mg/dL (ref 70–99)
Potassium: 4.6 mmol/L (ref 3.5–5.1)
Sodium: 137 mmol/L (ref 135–145)
Total Bilirubin: 0.5 mg/dL (ref 0.3–1.2)
Total Protein: 7.1 g/dL (ref 6.5–8.1)

## 2023-03-03 LAB — CBC
HCT: 42.5 % (ref 36.0–46.0)
Hemoglobin: 13.8 g/dL (ref 12.0–15.0)
MCH: 31.4 pg (ref 26.0–34.0)
MCHC: 32.5 g/dL (ref 30.0–36.0)
MCV: 96.6 fL (ref 80.0–100.0)
Platelets: 218 10*3/uL (ref 150–400)
RBC: 4.4 MIL/uL (ref 3.87–5.11)
RDW: 11.8 % (ref 11.5–15.5)
WBC: 5 10*3/uL (ref 4.0–10.5)
nRBC: 0 % (ref 0.0–0.2)

## 2023-03-03 LAB — TYPE AND SCREEN
ABO/RH(D): A POS
Antibody Screen: NEGATIVE

## 2023-03-03 NOTE — Progress Notes (Signed)
COVID Vaccine Completed:  Yes  Date of COVID positive in last 90 days:  No  PCP - [facility] Cardiologist - N/A  Chest x-ray -  N/A EKG -  N/A Stress Test -  N/A ECHO -  N/A Cardiac Cath -  N/A Pacemaker/ICD device last checked: Spinal Cord Stimulator: N/A  Bowel Prep -  N/A  Sleep Study -  N/A CPAP -   Fasting Blood Sugar -  N/A Checks Blood Sugar _____ times a day  Last dose of GLP1 agonist-  N/A GLP1 instructions:  N/A   Last dose of SGLT-2 inhibitors-  N/A SGLT-2 instructions: N/A   Blood Thinner Instructions:   N/A Aspirin Instructions: Last Dose:  Activity level:  Can go up a flight of stairs and perform activities of daily living without stopping and without symptoms of chest pain or shortness of breath.  Able to exercise without symptoms  Anesthesia review:  N/A  Patient denies shortness of breath, fever, cough and chest pain at PAT appointment  Patient verbalized understanding of instructions that were given to them at the PAT appointment. Patient was also instructed that they will need to review over the PAT instructions again at home before surgery.

## 2023-03-06 ENCOUNTER — Other Ambulatory Visit: Payer: Self-pay | Admitting: Gynecologic Oncology

## 2023-03-06 ENCOUNTER — Ambulatory Visit: Payer: PPO | Admitting: Psychiatry

## 2023-03-06 ENCOUNTER — Telehealth: Payer: Self-pay | Admitting: *Deleted

## 2023-03-06 DIAGNOSIS — N9489 Other specified conditions associated with female genital organs and menstrual cycle: Secondary | ICD-10-CM

## 2023-03-06 MED ORDER — SENNOSIDES-DOCUSATE SODIUM 8.6-50 MG PO TABS
2.0000 | ORAL_TABLET | Freq: Every day | ORAL | 0 refills | Status: DC
Start: 2023-03-06 — End: 2024-03-19

## 2023-03-06 MED ORDER — TRAMADOL HCL 50 MG PO TABS
50.0000 mg | ORAL_TABLET | Freq: Four times a day (QID) | ORAL | 0 refills | Status: DC | PRN
Start: 2023-03-06 — End: 2023-03-15

## 2023-03-06 NOTE — Telephone Encounter (Signed)
Telephone call to check on pre-operative status.  Patient compliant with pre-operative instructions.  Reinforced nothing to eat after midnight. Clear liquids until 0745. Patient to arrive at 0845.  No questions or concerns voiced.  Instructed to call for any needs.  °

## 2023-03-07 ENCOUNTER — Other Ambulatory Visit: Payer: Self-pay

## 2023-03-07 ENCOUNTER — Ambulatory Visit (HOSPITAL_BASED_OUTPATIENT_CLINIC_OR_DEPARTMENT_OTHER): Payer: PPO | Admitting: Anesthesiology

## 2023-03-07 ENCOUNTER — Ambulatory Visit (HOSPITAL_COMMUNITY)
Admission: RE | Admit: 2023-03-07 | Discharge: 2023-03-07 | Disposition: A | Discharge status: Home / Self Care | Payer: PPO | Attending: Psychiatry | Admitting: Psychiatry

## 2023-03-07 ENCOUNTER — Ambulatory Visit (HOSPITAL_COMMUNITY): Payer: PPO | Admitting: Anesthesiology

## 2023-03-07 ENCOUNTER — Encounter (HOSPITAL_COMMUNITY)
Admission: RE | Disposition: A | Discharge status: Home / Self Care | Payer: Self-pay | Source: Home / Self Care | Attending: Psychiatry

## 2023-03-07 ENCOUNTER — Encounter (HOSPITAL_COMMUNITY): Payer: Self-pay | Admitting: Psychiatry

## 2023-03-07 DIAGNOSIS — D259 Leiomyoma of uterus, unspecified: Secondary | ICD-10-CM | POA: Diagnosis present

## 2023-03-07 DIAGNOSIS — F17210 Nicotine dependence, cigarettes, uncomplicated: Secondary | ICD-10-CM | POA: Insufficient documentation

## 2023-03-07 DIAGNOSIS — Z98891 History of uterine scar from previous surgery: Secondary | ICD-10-CM | POA: Diagnosis not present

## 2023-03-07 DIAGNOSIS — N9489 Other specified conditions associated with female genital organs and menstrual cycle: Secondary | ICD-10-CM | POA: Diagnosis not present

## 2023-03-07 HISTORY — PX: ROBOTIC ASSISTED BILATERAL SALPINGO OOPHERECTOMY: SHX6078

## 2023-03-07 LAB — TYPE AND SCREEN

## 2023-03-07 LAB — ABO/RH: ABO/RH(D): A POS

## 2023-03-07 SURGERY — SALPINGO-OOPHORECTOMY, BILATERAL, ROBOT-ASSISTED
Anesthesia: General

## 2023-03-07 MED ORDER — DEXAMETHASONE SODIUM PHOSPHATE 10 MG/ML IJ SOLN
INTRAMUSCULAR | Status: DC | PRN
  Administered 2023-03-07: 6 mg via INTRAVENOUS

## 2023-03-07 MED ORDER — LIDOCAINE HCL (PF) 2 % IJ SOLN
INTRAMUSCULAR | Status: AC
  Filled 2023-03-07: qty 10

## 2023-03-07 MED ORDER — BUPIVACAINE HCL 0.25 % IJ SOLN
INTRAMUSCULAR | Status: DC | PRN
  Administered 2023-03-07: 42 mL

## 2023-03-07 MED ORDER — BUPIVACAINE HCL 0.25 % IJ SOLN
INTRAMUSCULAR | Status: AC
  Filled 2023-03-07: qty 1

## 2023-03-07 MED ORDER — LACTATED RINGERS IV SOLN: INTRAVENOUS | Status: DC

## 2023-03-07 MED ORDER — SCOPOLAMINE 1 MG/3DAYS TD PT72
1.0000 | MEDICATED_PATCH | TRANSDERMAL | Status: DC
  Administered 2023-03-07: 1.5 mg via TRANSDERMAL

## 2023-03-07 MED ORDER — ORAL CARE MOUTH RINSE: 15.0000 mL | Freq: Once | OROMUCOSAL | Status: AC

## 2023-03-07 MED ORDER — DEXAMETHASONE SODIUM PHOSPHATE 4 MG/ML IJ SOLN: 4.0000 mg | INTRAMUSCULAR | Status: DC

## 2023-03-07 MED ORDER — PHENYLEPHRINE 80 MCG/ML (10ML) SYRINGE FOR IV PUSH (FOR BLOOD PRESSURE SUPPORT)
PREFILLED_SYRINGE | INTRAVENOUS | Status: AC
  Filled 2023-03-07: qty 10

## 2023-03-07 MED ORDER — LACTATED RINGERS IR SOLN
Status: DC | PRN
  Administered 2023-03-07: 1000 mL

## 2023-03-07 MED ORDER — ACETAMINOPHEN 500 MG PO TABS
1000.0000 mg | ORAL_TABLET | ORAL | Status: AC
  Administered 2023-03-07: 1000 mg via ORAL
  Filled 2023-03-07: qty 2

## 2023-03-07 MED ORDER — LACTATED RINGERS IV SOLN: INTRAVENOUS | Status: DC | PRN

## 2023-03-07 MED ORDER — LIDOCAINE HCL (PF) 2 % IJ SOLN
INTRAMUSCULAR | Status: AC
  Filled 2023-03-07: qty 5

## 2023-03-07 MED ORDER — PROPOFOL 10 MG/ML IV BOLUS
INTRAVENOUS | Status: DC | PRN
Start: 2023-03-07 — End: 2023-03-07
  Administered 2023-03-07: 120 mg via INTRAVENOUS

## 2023-03-07 MED ORDER — ONDANSETRON HCL 4 MG/2ML IJ SOLN
INTRAMUSCULAR | Status: DC | PRN
  Administered 2023-03-07: 4 mg via INTRAVENOUS

## 2023-03-07 MED ORDER — CHLORHEXIDINE GLUCONATE 0.12 % MT SOLN
15.0000 mL | Freq: Once | OROMUCOSAL | Status: AC
  Administered 2023-03-07: 15 mL via OROMUCOSAL

## 2023-03-07 MED ORDER — ROCURONIUM BROMIDE 10 MG/ML (PF) SYRINGE
PREFILLED_SYRINGE | INTRAVENOUS | Status: DC | PRN
  Administered 2023-03-07: 60 mg via INTRAVENOUS

## 2023-03-07 MED ORDER — ROCURONIUM BROMIDE 10 MG/ML (PF) SYRINGE
PREFILLED_SYRINGE | INTRAVENOUS | Status: AC
  Filled 2023-03-07: qty 10

## 2023-03-07 MED ORDER — LIDOCAINE 2% (20 MG/ML) 5 ML SYRINGE
INTRAMUSCULAR | Status: DC | PRN
  Administered 2023-03-07: 60 mg via INTRAVENOUS
  Administered 2023-03-07: 1.5 mg/kg/h via INTRAVENOUS

## 2023-03-07 MED ORDER — STERILE WATER FOR IRRIGATION IR SOLN
Status: DC | PRN
  Administered 2023-03-07: 1000 mL

## 2023-03-07 MED ORDER — PHENYLEPHRINE 80 MCG/ML (10ML) SYRINGE FOR IV PUSH (FOR BLOOD PRESSURE SUPPORT)
PREFILLED_SYRINGE | INTRAVENOUS | Status: DC | PRN
  Administered 2023-03-07: 160 ug via INTRAVENOUS

## 2023-03-07 MED ORDER — MIDAZOLAM HCL 2 MG/2ML IJ SOLN
INTRAMUSCULAR | Status: DC | PRN
  Administered 2023-03-07: 2 mg via INTRAVENOUS

## 2023-03-07 MED ORDER — FENTANYL CITRATE (PF) 250 MCG/5ML IJ SOLN
INTRAMUSCULAR | Status: DC | PRN
  Administered 2023-03-07: 50 ug via INTRAVENOUS
  Administered 2023-03-07: 100 ug via INTRAVENOUS

## 2023-03-07 MED ORDER — ONDANSETRON HCL 4 MG/2ML IJ SOLN
INTRAMUSCULAR | Status: AC
  Filled 2023-03-07: qty 2

## 2023-03-07 MED ORDER — FENTANYL CITRATE (PF) 250 MCG/5ML IJ SOLN
INTRAMUSCULAR | Status: AC
  Filled 2023-03-07: qty 5

## 2023-03-07 MED ORDER — SUGAMMADEX SODIUM 200 MG/2ML IV SOLN
INTRAVENOUS | Status: DC | PRN
  Administered 2023-03-07: 120 mg via INTRAVENOUS

## 2023-03-07 MED ORDER — PROPOFOL 10 MG/ML IV BOLUS
INTRAVENOUS | Status: AC
  Filled 2023-03-07: qty 20

## 2023-03-07 MED ORDER — HYDROMORPHONE HCL 1 MG/ML IJ SOLN: 0.2500 mg | INTRAMUSCULAR | Status: DC | PRN

## 2023-03-07 MED ORDER — SCOPOLAMINE 1 MG/3DAYS TD PT72
MEDICATED_PATCH | TRANSDERMAL | Status: AC
  Filled 2023-03-07: qty 1

## 2023-03-07 MED ORDER — MIDAZOLAM HCL 2 MG/2ML IJ SOLN
INTRAMUSCULAR | Status: AC
  Filled 2023-03-07: qty 2

## 2023-03-07 MED ORDER — DEXAMETHASONE SODIUM PHOSPHATE 10 MG/ML IJ SOLN
INTRAMUSCULAR | Status: AC
  Filled 2023-03-07: qty 1

## 2023-03-07 MED ORDER — HEPARIN SODIUM (PORCINE) 5000 UNIT/ML IJ SOLN
5000.0000 [IU] | INTRAMUSCULAR | Status: AC
  Administered 2023-03-07: 5000 [IU] via SUBCUTANEOUS
  Filled 2023-03-07: qty 1

## 2023-03-07 SURGICAL SUPPLY — 78 items
ADH SKN CLS APL DERMABOND .7 (GAUZE/BANDAGES/DRESSINGS) ×1
AGENT HMST KT MTR STRL THRMB (HEMOSTASIS)
APL ESCP 34 STRL LF DISP (HEMOSTASIS)
APPLICATOR SURGIFLO ENDO (HEMOSTASIS) IMPLANT
BAG LAPAROSCOPIC 12 15 PORT 16 (BASKET) IMPLANT
BAG RETRIEVAL 12/15 (BASKET)
BLADE SURG SZ10 CARB STEEL (BLADE) IMPLANT
COVER BACK TABLE 60X90IN (DRAPES) ×1 IMPLANT
COVER MAYO STAND STRL (DRAPES) IMPLANT
COVER TIP SHEARS 8 DVNC (MISCELLANEOUS) ×1 IMPLANT
DERMABOND ADVANCED .7 DNX12 (GAUZE/BANDAGES/DRESSINGS) ×1 IMPLANT
DRAPE ARM DVNC X/XI (DISPOSABLE) ×4 IMPLANT
DRAPE COLUMN DVNC XI (DISPOSABLE) ×1 IMPLANT
DRAPE SHEET LG 3/4 BI-LAMINATE (DRAPES) ×1 IMPLANT
DRAPE SURG IRRIG POUCH 19X23 (DRAPES) ×1 IMPLANT
DRIVER NDL MEGA SUTCUT DVNCXI (INSTRUMENTS) ×1 IMPLANT
DRIVER NDLE MEGA SUTCUT DVNCXI (INSTRUMENTS)
DRSG OPSITE POSTOP 4X6 (GAUZE/BANDAGES/DRESSINGS) IMPLANT
DRSG OPSITE POSTOP 4X8 (GAUZE/BANDAGES/DRESSINGS) IMPLANT
ELECT PENCIL ROCKER SW 15FT (MISCELLANEOUS) IMPLANT
ELECT REM PT RETURN 15FT ADLT (MISCELLANEOUS) ×1 IMPLANT
FORCEPS BPLR FENES DVNC XI (FORCEP) ×1 IMPLANT
FORCEPS PROGRASP DVNC XI (FORCEP) ×1 IMPLANT
GAUZE 4X4 16PLY ~~LOC~~+RFID DBL (SPONGE) ×1 IMPLANT
GLOVE BIO SURGEON STRL SZ 6 (GLOVE) ×4 IMPLANT
GLOVE BIO SURGEON STRL SZ 6.5 (GLOVE) ×1 IMPLANT
GLOVE BIOGEL PI IND STRL 6.5 (GLOVE) ×2 IMPLANT
GOWN STRL REUS W/ TWL LRG LVL3 (GOWN DISPOSABLE) ×4 IMPLANT
GOWN STRL REUS W/TWL LRG LVL3 (GOWN DISPOSABLE) ×5
GRASPER SUT TROCAR 14GX15 (MISCELLANEOUS) IMPLANT
HOLDER FOLEY CATH W/STRAP (MISCELLANEOUS) IMPLANT
IRRIG SUCT STRYKERFLOW 2 WTIP (MISCELLANEOUS) ×1
IRRIGATION SUCT STRKRFLW 2 WTP (MISCELLANEOUS) ×1 IMPLANT
KIT TURNOVER KIT A (KITS) IMPLANT
LIGASURE IMPACT 36 18CM CVD LR (INSTRUMENTS) IMPLANT
MANIPULATOR ADVINCU DEL 3.0 PL (MISCELLANEOUS) IMPLANT
MANIPULATOR ADVINCU DEL 3.5 PL (MISCELLANEOUS) IMPLANT
MANIPULATOR UTERINE 4.5 ZUMI (MISCELLANEOUS) IMPLANT
NDL HYPO 21X1.5 SAFETY (NEEDLE) ×1 IMPLANT
NDL INSUFFLATION 14GA 120MM (NEEDLE) IMPLANT
NDL SPNL 20GX3.5 QUINCKE YW (NEEDLE) IMPLANT
NEEDLE HYPO 21X1.5 SAFETY (NEEDLE) ×1
NEEDLE INSUFFLATION 14GA 120MM (NEEDLE)
NEEDLE SPNL 20GX3.5 QUINCKE YW (NEEDLE)
OBTURATOR OPTICAL STND 8 DVNC (TROCAR) ×1
OBTURATOR OPTICALSTD 8 DVNC (TROCAR) ×1 IMPLANT
PACK ROBOT GYN CUSTOM WL (TRAY / TRAY PROCEDURE) ×1 IMPLANT
PAD ARMBOARD 7.5X6 YLW CONV (MISCELLANEOUS) ×1 IMPLANT
PAD POSITIONING PINK XL (MISCELLANEOUS) ×1 IMPLANT
PORT ACCESS TROCAR AIRSEAL 12 (TROCAR) IMPLANT
SCISSORS MNPLR CVD DVNC XI (INSTRUMENTS) ×1 IMPLANT
SCRUB CHG 4% DYNA-HEX 4OZ (MISCELLANEOUS) ×2 IMPLANT
SEAL UNIV 5-12 XI (MISCELLANEOUS) ×4 IMPLANT
SET TRI-LUMEN FLTR TB AIRSEAL (TUBING) ×1 IMPLANT
SPIKE FLUID TRANSFER (MISCELLANEOUS) ×1 IMPLANT
SPONGE T-LAP 18X18 ~~LOC~~+RFID (SPONGE) IMPLANT
SURGIFLO W/THROMBIN 8M KIT (HEMOSTASIS) IMPLANT
SUT MNCRL AB 4-0 PS2 18 (SUTURE) IMPLANT
SUT PDS AB 1 TP1 54 (SUTURE) IMPLANT
SUT VIC AB 0 CT1 27 (SUTURE)
SUT VIC AB 0 CT1 27XBRD ANTBC (SUTURE) IMPLANT
SUT VIC AB 2-0 CT1 27 (SUTURE)
SUT VIC AB 2-0 CT1 TAPERPNT 27 (SUTURE) IMPLANT
SUT VIC AB 4-0 PS2 18 (SUTURE) ×2 IMPLANT
SUT VICRYL 0 27 CT2 27 ABS (SUTURE) IMPLANT
SUT VLOC 180 0 9IN GS21 (SUTURE) IMPLANT
SYR 10ML LL (SYRINGE) IMPLANT
SYS BAG RETRIEVAL 10MM (BASKET) ×2
SYS WOUND ALEXIS 18CM MED (MISCELLANEOUS)
SYSTEM BAG RETRIEVAL 10MM (BASKET) IMPLANT
SYSTEM WOUND ALEXIS 18CM MED (MISCELLANEOUS) IMPLANT
TOWEL OR NON WOVEN STRL DISP B (DISPOSABLE) IMPLANT
TRAP SPECIMEN MUCUS 40CC (MISCELLANEOUS) IMPLANT
TRAY FOLEY MTR SLVR 16FR STAT (SET/KITS/TRAYS/PACK) ×1 IMPLANT
TROCAR PORT AIRSEAL 5X120 (TROCAR) IMPLANT
UNDERPAD 30X36 HEAVY ABSORB (UNDERPADS AND DIAPERS) ×2 IMPLANT
WATER STERILE IRR 1000ML POUR (IV SOLUTION) ×1 IMPLANT
YANKAUER SUCT BULB TIP 10FT TU (MISCELLANEOUS) IMPLANT

## 2023-03-07 NOTE — Interval H&P Note (Signed)
History and Physical Interval Note:  03/07/2023 11:29 AM  Brandi Lin  has presented today for surgery, with the diagnosis of ADNEXAL MASS.  The various methods of treatment have been discussed with the patient and family. After consideration of risks, benefits and other options for treatment, the patient has consented to  Procedure(s): XI ROBOTIC ASSISTED BILATERAL SALPINGO OOPHORECTOMY (N/A) POSSIBLE XI ROBOTIC ASSISTED TOTAL HYSTERECTOMY WITH POSSIBLE STAGING (N/A) as a surgical intervention.  The patient's history has been reviewed, patient examined, no change in status, stable for surgery.  I have reviewed the patient's chart and labs.  Questions were answered to the patient's satisfaction.     [name]

## 2023-03-07 NOTE — Anesthesia Preprocedure Evaluation (Addendum)
Anesthesia Evaluation  Patient identified by MRN, date of birth, ID band Patient awake    Reviewed: Allergy & Precautions, H&P , NPO status , Patient's Chart, lab work & pertinent test results  Airway Mallampati: II  TM Distance: >3 FB Neck ROM: Full    Dental no notable dental hx. (+) Teeth Intact, Dental Advisory Given   Pulmonary Current Smoker and Patient abstained from smoking.   Pulmonary exam normal breath sounds clear to auscultation       Cardiovascular negative cardio ROS  Rhythm:Regular Rate:Normal     Neuro/Psych negative neurological ROS  negative psych ROS   GI/Hepatic negative GI ROS, Neg liver ROS,,,  Endo/Other  negative endocrine ROS    Renal/GU negative Renal ROS  negative genitourinary   Musculoskeletal   Abdominal   Peds  Hematology negative hematology ROS (+)   Anesthesia Other Findings   Reproductive/Obstetrics negative OB ROS                             Anesthesia Physical Anesthesia Plan  ASA: 2  Anesthesia Plan: General   Post-op Pain Management: Tylenol PO (pre-op)*   Induction: Intravenous  PONV Risk Score and Plan: 3 and Ondansetron, Dexamethasone and Midazolam  Airway Management Planned: Oral ETT  Additional Equipment:   Intra-op Plan:   Post-operative Plan: Extubation in OR  Informed Consent: I have reviewed the patients History and Physical, chart, labs and discussed the procedure including the risks, benefits and alternatives for the proposed anesthesia with the patient or authorized representative who has indicated his/her understanding and acceptance.     Dental advisory given  Plan Discussed with: CRNA  Anesthesia Plan Comments:        Anesthesia Quick Evaluation

## 2023-03-07 NOTE — Anesthesia Postprocedure Evaluation (Signed)
Anesthesia Post Note  Patient: Brandi Lin  Procedure(s) Performed: XI ROBOTIC ASSISTED RIGHT  SALPINGO OOPHORECTOMY AND LEFT SALPINGECTOMY     Patient location during evaluation: PACU Anesthesia Type: General Level of consciousness: awake and alert Pain management: pain level controlled Vital Signs Assessment: post-procedure vital signs reviewed and stable Respiratory status: spontaneous breathing, nonlabored ventilation and respiratory function stable Cardiovascular status: blood pressure returned to baseline and stable Postop Assessment: no apparent nausea or vomiting Anesthetic complications: no  No notable events documented.  Last Vitals:  Vitals:   03/07/23 1415 03/07/23 1430  BP: (!) 156/91 139/78  Pulse: 67 65  Resp: 17 16  Temp:    SpO2: 100% 100%    Last Pain:  Vitals:   03/07/23 1430  TempSrc:   PainSc: 0-No pain                 Daniele Yankowski,W. EDMOND

## 2023-03-07 NOTE — Anesthesia Procedure Notes (Signed)
Procedure Name: Intubation Date/Time: 03/07/2023 12:19 PM  Performed by: Florene Route, CRNAPre-anesthesia Checklist: Patient identified, Emergency Drugs available, Suction available and Patient being monitored Patient Re-evaluated:Patient Re-evaluated prior to induction Oxygen Delivery Method: Circle system utilized Preoxygenation: Pre-oxygenation with 100% oxygen Induction Type: IV induction Ventilation: Mask ventilation without difficulty and Oral airway inserted - appropriate to patient size Laryngoscope Size: Hyacinth Meeker and 2 Grade View: Grade I Tube type: Oral Tube size: 7.5 mm Number of attempts: 1 Airway Equipment and Method: Stylet and Oral airway Placement Confirmation: ETT inserted through vocal cords under direct vision, positive ETCO2 and breath sounds checked- equal and bilateral Secured at: 21 cm Tube secured with: Tape Dental Injury: Teeth and Oropharynx as per pre-operative assessment

## 2023-03-07 NOTE — Transfer of Care (Signed)
Immediate Anesthesia Transfer of Care Note  Patient: Brandi Lin  Procedure(s) Performed: XI ROBOTIC ASSISTED RIGHT  SALPINGO OOPHORECTOMY AND LEFT SALPINGECTOMY  Patient Location: PACU  Anesthesia Type:General  Level of Consciousness: awake  Airway & Oxygen Therapy: Patient Spontanous Breathing and Patient connected to face mask oxygen  Post-op Assessment: Report given to RN and Post -op Vital signs reviewed and stable  Post vital signs: Reviewed and stable  Last Vitals:  Vitals Value Taken Time  BP 148/65 03/07/23 1349  Temp    Pulse    Resp 18 03/07/23 1350  SpO2    Vitals shown include unfiled device data.  Last Pain:  Vitals:   03/07/23 0923  TempSrc:   PainSc: 0-No pain         Complications: No notable events documented.

## 2023-03-07 NOTE — Op Note (Signed)
GYNECOLOGIC ONCOLOGY OPERATIVE NOTE  Date of Service: 03/07/2023  Preoperative Diagnosis: Solid adnexal mass  Postoperative Diagnosis: Pedunculated uterine fibroid  Procedures: Robotic assisted right salpingo-oophorectomy, left salpingectomy  Surgeon: Clide Cliff, MD  Assistants: Antionette Char, MD and (an MD assistant was necessary for tissue manipulation, management of robotic instrumentation, retraction and positioning due to the complexity of the case and hospital policies)  Anesthesia: General  Estimated Blood Loss: 5 mL  Fluids: see anesthesia record  Urine Output: 25 ml, clear yellow  Findings: On entry to abdomen, normal upper abdominal survey with smooth diaphragm, liver, stomach and normal appearing omentum and bowel. Normal right fallopian tube and ovary. Small normal uterus. Normal left fallopian tube. Left solid adnexal mass, identified to be a pedunculated uterine fibroid, extended into the left adnexa in the region of the left utero-ovarian ligament, adjacent to normal appearing left ovary.   Specimens:  ID Type Source Tests Collected by Time Destination  1 : Left tube Tissue PATH Gyn benign resection SURGICAL PATHOLOGY Clide Cliff, MD 03/07/2023 1315   2 : Right tube and ovary Tissue PATH Gyn tumor resection SURGICAL PATHOLOGY Clide Cliff, MD 03/07/2023 1318   A : Pelvic washings Body Fluid PATH Cytology Pelvic Washing CYTOLOGY - NON PAP Clide Cliff, MD 03/07/2023 1254     Complications:  None  Indications for Procedure: Brandi Lin is a 67 y.o. woman with a solid left adnexal mass.  Prior to the procedure, all risks, benefits, and alternatives were discussed and informed surgical consent was signed.  Procedure: Patient was taken to the operating room where general anesthesia was achieved.  She was positioned in dorsal lithotomy and prepped and draped.  A foley catheter was inserted into the bladder.  A hulka manipulator was secured in the  uterus..    A 5 mm incision was made in the left upper quadrant near Palmer's point.  The abdomen was entered with a 5 mm OptiView trocar under direct visualization.  The abdomen was insufflated, the patient placed in steep Trendelenburg. A solid left adnexal mass was identified, so we proceeding with the procedure. Additional trocars were placed as follows: an 8mm robotic trocar superior to the umbilicus, an 8 mm robotic trocar in the right abdomen, and one 8 mm robotic trocar in the left abdomen.  The left upper quadrant trocar was removed and replaced with a 12 mm airseal trocar.  All trocars were placed under direct visualization.  The bowels were moved into the upper abdomen.  The DaVinci robotic surgical system was brought to the patient's bedside and docked.  Pelvic washings were obtained. The right retroperitoneum was incised and entered.  The right ureter was identified.  The right infundibulopelvic ligament was isolated, cauterized, and transected.  The broad ligament was incised to the uterine cornu.  The utero-ovarian ligament and the proximal fallopian tube were isolated, cauterized, and transected. The specimen was placed in an endocatch bag for later retrieval.  Attention was then turned to the left. On closer evaluation of the left adnexa, the solid mass was noted to be a pedunculated uterine fibroid, extending from the uterus in the location of the utero-ovarian ligament towards the left ovary which was normal in appearance. Given a benign appearing fibroid and otherwise normal left ovary, decision made to defer left oophorectomy as this would likely increase risk of bleeding, need for hysterectomy given the fibroid's location in relation to the left utero-ovarian ligament. A left salpingectomy was performed by cauterizing and transecting the mesosalpinx  from the distal fimbria to the cornua. The proximal fallopian tube was cauterized and transected. The left fallopian tube was removed through  the assistant trocar.  The pelvis was irrigated and all operative sites were found to be hemostatic.  All instruments were removed and the robot was taken from the patient's bedside. The endocatch bag with the right tube and ovary were removed through the assistant trocar. The fascia at the 12 mm incision was closed with 0 Vicryl with the assistance of a PMI device. The abdomen was desufflated and all ports were removed. The skin at all incisions was closed with 4-0 Vicryl to reapproximate the subcutaneous tissue and 4-0 monocryl in a subcuticular fashion followed by surgical glue.  Patient tolerated the procedure well. Sponge, lap, and instrument counts were correct.  No perioperative antibiotic prophylaxis was indicated for this procedure.  She was extubated and taken to the PACU in stable condition.  Clide Cliff, MD Gynecologic Oncology

## 2023-03-07 NOTE — Discharge Instructions (Addendum)
AFTER SURGERY INSTRUCTIONS   Return to work: 4-6 weeks if applicable   Activity: 1. Be up and out of the bed during the day.  Take a nap if needed.  You may walk up steps but be careful and use the hand rail.  Stair climbing will tire you more than you think, you may need to stop part way and rest.    2. No lifting or straining for 6 weeks over 10 pounds. No pushing, pulling, straining for 6 weeks.   3. No driving for around 1 week(s).  Do not drive if you are taking narcotic pain medicine and make sure that your reaction time has returned.    4. You can shower as soon as the next day after surgery. Shower daily.  Use your regular soap and water (not directly on the incision) and pat your incision(s) dry afterwards; don't rub.  No tub baths or submerging your body in water until cleared by your surgeon. If you have the soap that was given to you by pre-surgical testing that was used before surgery, you do not need to use it afterwards because this can irritate your incisions.    5. No sexual activity and nothing in the vagina for 4-6 weeks, 12 weeks if you have a hysterectomy.   6. You may experience a small amount of clear drainage from your incisions, which is normal.  If the drainage persists, increases, or changes color please call the office.   7. Do not use creams, lotions, or ointments such as neosporin on your incisions after surgery until advised by your surgeon because they can cause removal of the dermabond glue on your incisions.     8. You may experience vaginal spotting after surgery or around the 6-8 week mark from surgery when the stitches at the top of the vagina begin to dissolve.  The spotting is normal but if you experience heavy bleeding, call our office.   9. Take Tylenol or ibuprofen first for pain if you are able to take these medications and only use narcotic pain medication for severe pain not relieved by the Tylenol or Ibuprofen.  Monitor your Tylenol intake to a max  of 4,000 mg in a 24 hour period. You can alternate these medications after surgery.   Diet: 1. Low sodium Heart Healthy Diet is recommended but you are cleared to resume your normal (before surgery) diet after your procedure.   2. It is safe to use a laxative, such as Miralax or Colace, if you have difficulty moving your bowels. You have been prescribed Sennakot-S to take at bedtime every evening after surgery to keep bowel movements regular and to prevent constipation.     Wound Care: 1. Keep clean and dry.  Shower daily.   Reasons to call the Doctor: Fever - Oral temperature greater than 100.4 degrees Fahrenheit Foul-smelling vaginal discharge Difficulty urinating Nausea and vomiting Increased pain at the site of the incision that is unrelieved with pain medicine. Difficulty breathing with or without chest pain New calf pain especially if only on one side Sudden, continuing increased vaginal bleeding with or without clots.   Contacts: For questions or concerns you should contact:   Dr. Clide Cliff at [phone]   Warner Mccreedy, NP at [phone]   After Hours: call [phone] and have the GYN Oncologist paged/contacted (after 5 pm or on the weekends). You will speak with an after hours RN and let he or she know you have had surgery.  Messages sent via mychart are for non-urgent matters and are not responded to after hours so for urgent needs, please call the after hours number.

## 2023-03-08 ENCOUNTER — Telehealth: Payer: Self-pay | Admitting: *Deleted

## 2023-03-08 ENCOUNTER — Encounter (HOSPITAL_COMMUNITY): Payer: Self-pay | Admitting: Psychiatry

## 2023-03-08 LAB — CYTOLOGY - NON PAP

## 2023-03-08 NOTE — Telephone Encounter (Addendum)
Spoke with Brandi Lin this morning. She states she is eating, drinking and urinating well. Pt states yesterday after they took the catheter out she was having burning on urination but now it has subsided. Advised pt that this can happen once having foley catheter in and the burning will go away but call the office if it returns or with any other urinary symptoms. She has not had a BM yet but is passing gas. She is taking senokot as prescribed and encouraged her to drink plenty of water. She denies fever or chills. Incisions are dry and intact. She rates her pain 6/10. Her pain is controlled with advil only.    Instructed to call office with any fever, chills, purulent drainage, uncontrolled pain or any other questions or concerns. Patient verbalizes understanding.   Pt aware of post op appointments as well as the office number [phone] and after hours number [phone] to call if she has any questions or concerns

## 2023-03-09 LAB — SURGICAL PATHOLOGY

## 2023-03-20 ENCOUNTER — Other Ambulatory Visit: Payer: Self-pay

## 2023-03-20 ENCOUNTER — Inpatient Hospital Stay: Payer: PPO | Attending: Psychiatry | Admitting: Psychiatry

## 2023-03-20 VITALS — BP 124/63 | HR 81 | Temp 98.7°F | Resp 16 | Ht 64.0 in | Wt 119.1 lb

## 2023-03-20 DIAGNOSIS — D252 Subserosal leiomyoma of uterus: Secondary | ICD-10-CM

## 2023-03-20 DIAGNOSIS — N9489 Other specified conditions associated with female genital organs and menstrual cycle: Secondary | ICD-10-CM

## 2023-03-20 DIAGNOSIS — Z90721 Acquired absence of ovaries, unilateral: Secondary | ICD-10-CM

## 2023-03-20 NOTE — Progress Notes (Signed)
Gynecologic Oncology Return Clinic Visit  Date of Service: 03/20/2023 Referring Provider: Marinda Elk, MD   Assessment & Plan: Brandi Lin is a 67 y.o. woman with a pelvic mass, found at surgery to be a pedunculated uterine fibroid, who is s/p RA-RSO, left salpingectomy on 03/07/23.  Postop: - Pt recovering well from surgery and healing appropriately postoperatively - Intraoperative findings and pathology results reviewed. Adnexal mass determined to be a benign appearing pedunculated fibroid which emanated from the left posterior uterus near the insertion of the utero-ovarian ligament, so decision made to defer left oophorectomy given normal appearing left ovary. Left fallopian tube and right tube and ovary removed and benign. - Ongoing postoperative expectations and precautions reviewed. Okay to lift in 1-2 weeks. Can reintroduce normal activities otherwise. Safe to return to routine Ob/Gyn care.  RTC prn.  Clide Cliff, MD Gynecologic Oncology   ----------------------- Reason for Visit: Postop    Interval History: Pt reports that she is recovering well from surgery. She is no having pain. She is eating and drinking well. She is voiding without issue and having regular bowel movements. Reports pain with urination for 1-2 days after surgery. She took some azo and it has since stopped.   Past Medical/Surgical History: Past Medical History:  Diagnosis Date   Colitis     Past Surgical History:  Procedure Laterality Date   ABDOMINOPLASTY     CESAREAN SECTION     COLONOSCOPY W/ POLYPECTOMY  1991   ROBOTIC ASSISTED BILATERAL SALPINGO OOPHERECTOMY N/A 03/07/2023   Procedure: XI ROBOTIC ASSISTED RIGHT  SALPINGO OOPHORECTOMY AND LEFT SALPINGECTOMY;  Surgeon: Clide Cliff, MD;  Location: [facility];  Service: Gynecology;  Laterality: N/A;    Family History  Problem Relation Age of Onset   Cancer Mother        Blood cancer   Cancer Maternal Grandmother        unknown  type   Lung cancer Maternal Grandfather    Lung cancer Paternal Grandfather    Cancer Maternal Uncle        blood cancer   Cancer Maternal Uncle        blood cancer   Breast cancer Neg Hx    Pancreatic cancer Neg Hx    Ovarian cancer Neg Hx    Endometrial cancer Neg Hx    Prostate cancer Neg Hx     Social History   Socioeconomic History   Marital status: Married    Spouse name: Not on file   Number of children: Not on file   Years of education: Not on file   Highest education level: Not on file  Occupational History   Not on file  Tobacco Use   Smoking status: Every Day    Current packs/day: 0.10    Average packs/day: 0.1 packs/day for 45.0 years (4.5 ttl pk-yrs)    Types: Cigarettes   Smokeless tobacco: Not on file   Tobacco comments:    2 packs weekly   Vaping Use   Vaping status: Never Used  Substance and Sexual Activity   Alcohol use: Not Currently    Alcohol/week: 7.0 standard drinks of alcohol    Types: 7 Glasses of wine per week   Drug use: No   Sexual activity: Yes    Birth control/protection: Post-menopausal  Other Topics Concern   Not on file  Social History Narrative   Not on file   Social Determinants of Health   Financial Resource Strain: Not on file  Food  Insecurity: No Food Insecurity (12/31/2022)   Hunger Vital Sign    Worried About Running Out of Food in the Last Year: Never true    Ran Out of Food in the Last Year: Never true  Transportation Needs: No Transportation Needs (12/31/2022)   PRAPARE - Administrator, Civil Service (Medical): No    Lack of Transportation (Non-Medical): No  Physical Activity: Not on file  Stress: Not on file  Social Connections: Not on file    Current Medications:  Current Outpatient Medications:    Ascorbic Acid (VITAMIN C) 1000 MG tablet, Take 1,000 mg by mouth daily., Disp: , Rfl:    buPROPion (WELLBUTRIN XL) 150 MG 24 hr tablet, Take 150 mg by mouth every morning., Disp: , Rfl:     cholecalciferol (VITAMIN D3) 25 MCG (1000 UNIT) tablet, Take 1,000 Units by mouth daily., Disp: , Rfl:    L-CITRULLINE PO, Take 850 mg by mouth daily., Disp: , Rfl:    Multiple Vitamin (MULTIVITAMIN WITH MINERALS) TABS tablet, Take 1 tablet by mouth daily., Disp: , Rfl:    Omega-3 Fatty Acids (FISH OIL) 1000 MG CAPS, Take 1,000 mg by mouth daily., Disp: , Rfl:    senna-docusate (SENOKOT-S) 8.6-50 MG tablet, Take 2 tablets by mouth at bedtime. For AFTER surgery, do not take if having diarrhea, Disp: 30 tablet, Rfl: 0  Review of Symptoms: Complete 10-system review is negative except as above in Interval History.  Physical Exam: BP 124/63   Pulse 81   Temp 98.7 F (37.1 C) (Oral)   Resp 16   Ht 5\' 4"  (1.626 m)   Wt 119 lb 2 oz (54 kg)   SpO2 98%   BMI 20.45 kg/m  General: Alert, oriented, no acute distress. HEENT: Normocephalic, atraumatic. Neck symmetric without masses. Sclera anicteric.  Chest: Normal work of breathing. Clear to auscultation bilaterally.   Cardiovascular: Regular rate and rhythm, no murmurs. Abdomen: Soft, nontender.  Normoactive bowel sounds.  No masses appreciated.  Well-healing incisions with glue. Extremities: Grossly normal range of motion.  Warm, well perfused.  No edema bilaterally. Skin: No rashes or lesions noted.  Laboratory & Radiologic Studies: Surgical pathology (03/07/23): FINAL MICROSCOPIC DIAGNOSIS:   A. FALLOPIAN TUBE, LEFT, SALPINGECTOMY:  -  Unremarkable fimbriated end and full cross-section of fallopian tube.   B. OVARY AND FALLOPIAN TUBE, RIGHT, SALPINGO OOPHORECTOMY:  -  Unremarkable senescent ovary.  -  Unremarkable fimbriated end and full cross-section of fallopian tube.   Cytology (03/07/23): FINAL MICROSCOPIC DIAGNOSIS:  - No malignant cells identified

## 2023-03-20 NOTE — Patient Instructions (Signed)
It was a pleasure to see you in clinic today. - Okay to lift in 1-2 weeks. - Okay to resume routine activities otherwise. - Okay to resume routine Ob/Gyn care.  Thank you very much for allowing me to provide care for you today.  I appreciate your confidence in choosing our Gynecologic Oncology team at [facility].  If you have any questions about your visit today please call our office or send [place] a MyChart message and we will get back to you as soon as possible.

## 2023-03-27 ENCOUNTER — Encounter: Payer: Self-pay | Admitting: Psychiatry

## 2023-05-24 ENCOUNTER — Other Ambulatory Visit: Payer: Self-pay | Admitting: Emergency Medicine

## 2023-05-24 DIAGNOSIS — Z122 Encounter for screening for malignant neoplasm of respiratory organs: Secondary | ICD-10-CM

## 2023-05-24 DIAGNOSIS — F17210 Nicotine dependence, cigarettes, uncomplicated: Secondary | ICD-10-CM

## 2023-05-24 DIAGNOSIS — Z87891 Personal history of nicotine dependence: Secondary | ICD-10-CM

## 2023-06-02 ENCOUNTER — Ambulatory Visit (INDEPENDENT_AMBULATORY_CARE_PROVIDER_SITE_OTHER): Payer: PPO | Admitting: Adult Health

## 2023-06-02 ENCOUNTER — Encounter: Payer: Self-pay | Admitting: Adult Health

## 2023-06-02 DIAGNOSIS — F17210 Nicotine dependence, cigarettes, uncomplicated: Secondary | ICD-10-CM

## 2023-06-02 NOTE — Patient Instructions (Signed)

## 2023-06-02 NOTE — Progress Notes (Signed)
  Virtual Visit via Telephone Note  I connected with Brandi Lin , 06/02/23 11:14 AM by a telemedicine application and verified that I am speaking with the correct person using two identifiers.  Location: Patient: home Provider: home   I discussed the limitations of evaluation and management by telemedicine and the availability of in person appointments. The patient expressed understanding and agreed to proceed.   Shared Decision Making Visit Lung Cancer Screening Program [phone])   Eligibility: 67 y.o. Pack Years Smoking History Calculation =38pack years (# packs/per year x # years smoked) Recent History of coughing up blood  no Unexplained weight loss? no ( >Than 15 pounds within the last 6 months ) Prior History Lung / other cancer yes - colon  (Diagnosis within the last 5 years already requiring surveillance chest CT Scans). Smoking Status Current Smoker  Visit Components: Discussion included one or more decision making aids. YES Discussion included risk/benefits of screening. YES Discussion included potential follow up diagnostic testing for abnormal scans. YES Discussion included meaning and risk of over diagnosis. YES Discussion included meaning and risk of False Positives. YES Discussion included meaning of total radiation exposure. YES  Counseling Included: Importance of adherence to annual lung cancer LDCT screening. YES Impact of comorbidities on ability to participate in the program. YES Ability and willingness to under diagnostic treatment. YES  Smoking Cessation Counseling: Current Smokers:  Discussed importance of smoking cessation. yes Information about tobacco cessation classes and interventions provided to patient. yes Patient provided with "ticket" for LDCT Scan. yes Symptomatic Patient. no Asymptomatic Patient yes  Counseling (Intermediate counseling: > three minutes counseling) [phone] Patient provided with "ticket" for LDCT Scan. yes Written  Order for Lung Cancer Screening with LDCT placed in Epic. Yes (CT Chest Lung Cancer Screening Low Dose W/O CM) [id number]  Z12.2-Screening of respiratory organs Z87.891-Personal history of nicotine dependence   [name] 06/02/23

## 2023-06-06 ENCOUNTER — Ambulatory Visit
Admission: RE | Admit: 2023-06-06 | Discharge: 2023-06-06 | Disposition: A | Discharge status: Home / Self Care | Payer: PPO | Source: Ambulatory Visit | Attending: Acute Care | Admitting: Acute Care

## 2023-06-06 DIAGNOSIS — Z87891 Personal history of nicotine dependence: Secondary | ICD-10-CM

## 2023-06-06 DIAGNOSIS — F17210 Nicotine dependence, cigarettes, uncomplicated: Secondary | ICD-10-CM

## 2023-06-06 DIAGNOSIS — Z122 Encounter for screening for malignant neoplasm of respiratory organs: Secondary | ICD-10-CM

## 2023-06-19 ENCOUNTER — Other Ambulatory Visit: Payer: Self-pay | Admitting: Acute Care

## 2023-06-19 DIAGNOSIS — Z122 Encounter for screening for malignant neoplasm of respiratory organs: Secondary | ICD-10-CM

## 2023-06-19 DIAGNOSIS — Z87891 Personal history of nicotine dependence: Secondary | ICD-10-CM

## 2023-06-19 DIAGNOSIS — F17210 Nicotine dependence, cigarettes, uncomplicated: Secondary | ICD-10-CM

## 2023-07-05 ENCOUNTER — Other Ambulatory Visit: Payer: Self-pay | Admitting: Internal Medicine

## 2023-07-05 DIAGNOSIS — Z1231 Encounter for screening mammogram for malignant neoplasm of breast: Secondary | ICD-10-CM

## 2023-07-21 ENCOUNTER — Ambulatory Visit
Admission: RE | Admit: 2023-07-21 | Discharge: 2023-07-21 | Disposition: A | Discharge status: Home / Self Care | Payer: PPO | Source: Ambulatory Visit | Attending: Internal Medicine

## 2023-07-21 DIAGNOSIS — Z1231 Encounter for screening mammogram for malignant neoplasm of breast: Secondary | ICD-10-CM

## 2023-10-13 ENCOUNTER — Other Ambulatory Visit: Payer: Self-pay | Admitting: Family Medicine

## 2023-10-13 DIAGNOSIS — H9312 Tinnitus, left ear: Secondary | ICD-10-CM

## 2023-10-18 ENCOUNTER — Ambulatory Visit
Admission: RE | Admit: 2023-10-18 | Discharge: 2023-10-18 | Discharge status: Home / Self Care | Source: Ambulatory Visit | Attending: Family Medicine | Admitting: Family Medicine

## 2023-10-18 DIAGNOSIS — H9312 Tinnitus, left ear: Secondary | ICD-10-CM

## 2023-10-27 ENCOUNTER — Other Ambulatory Visit: Payer: PPO

## 2023-11-03 ENCOUNTER — Encounter: Payer: Self-pay | Admitting: Neurology

## 2023-11-06 ENCOUNTER — Ambulatory Visit
Admission: RE | Admit: 2023-11-06 | Discharge: 2023-11-06 | Disposition: A | Discharge status: Home / Self Care | Payer: PPO | Source: Ambulatory Visit | Attending: Internal Medicine

## 2023-11-06 DIAGNOSIS — Z13820 Encounter for screening for osteoporosis: Secondary | ICD-10-CM

## 2024-02-06 ENCOUNTER — Encounter (INDEPENDENT_AMBULATORY_CARE_PROVIDER_SITE_OTHER): Payer: Self-pay

## 2024-02-07 ENCOUNTER — Other Ambulatory Visit: Payer: Self-pay | Admitting: Medical Genetics

## 2024-03-18 NOTE — Progress Notes (Unsigned)
 NEUROLOGY CONSULTATION NOTE  Brandi Lin MRN: [id number] DOB: 14-Apr-1956  Referring provider: Milo Costa, DO Primary care provider: Richardson Lewis, MD  Reason for consult:  abnormal brain MRI  Assessment/Plan:   Cerebrovascular disease - likely age-related in patient with history of smoking    Smoking cessation and management of stroke risk factors as per PCP    Subjective:  Brandi Lin is a 68 year old right-handed female with history of smoking who presents for abnormal brain MRI.  History supplemented by referring provider's note.  MRI of brain personally reviewed.  She had an MRI of the brain performed for evaluation of throbbing in her left ear that started a year ago.  She would only notice it when laying down at night but not up during the day.  No change in hearing or headache.  The throbbing has since resolved.    She had an MRI of the brain without contrast on 10/18/2023 which revealed:  No acute or reversible finding. Several small foci of T2 and FLAIR signal in the hemispheric white matter which could go along with old small vessel infarctions or chronic demyelinating disease.  No local abnormality seen in the region of the left ear or temporal bone to explain discomfort in that region.   She has been a cigarette smoker since she was a teenager.  No history of diabetes, hypertension or hyperlipidemia.  Lipid panel from July 2024 revealed total chol 224, TG 108, HDL 91, LDL 115 and non-HDL 133. Fasting glucose was 80.  PAST MEDICAL HISTORY: Past Medical History:  Diagnosis Date   Colitis     PAST SURGICAL HISTORY: Past Surgical History:  Procedure Laterality Date   ABDOMINOPLASTY     CESAREAN SECTION     COLONOSCOPY W/ POLYPECTOMY  1991   ROBOTIC ASSISTED BILATERAL SALPINGO OOPHERECTOMY N/A 03/07/2023   Procedure: XI ROBOTIC ASSISTED RIGHT  SALPINGO OOPHORECTOMY AND LEFT SALPINGECTOMY;  Surgeon: Eldonna Mays, MD;  Location: [facility];  Service:  Gynecology;  Laterality: N/A;    MEDICATIONS: Current Outpatient Medications on File Prior to Visit  Medication Sig Dispense Refill   Ascorbic Acid (VITAMIN C) 1000 MG tablet Take 1,000 mg by mouth daily.     buPROPion (WELLBUTRIN XL) 150 MG 24 hr tablet Take 150 mg by mouth every morning.     cholecalciferol (VITAMIN D3) 25 MCG (1000 UNIT) tablet Take 1,000 Units by mouth daily.     L-CITRULLINE PO Take 850 mg by mouth daily.     Multiple Vitamin (MULTIVITAMIN WITH MINERALS) TABS tablet Take 1 tablet by mouth daily.     Omega-3 Fatty Acids (FISH OIL) 1000 MG CAPS Take 1,000 mg by mouth daily.     senna-docusate (SENOKOT-S) 8.6-50 MG tablet Take 2 tablets by mouth at bedtime. For AFTER surgery, do not take if having diarrhea 30 tablet 0   No current facility-administered medications on file prior to visit.    ALLERGIES: No Known Allergies  FAMILY HISTORY: Family History  Problem Relation Age of Onset   Cancer Mother        Blood cancer   Cancer Maternal Grandmother        unknown type   Lung cancer Maternal Grandfather    Lung cancer Paternal Grandfather    Cancer Maternal Uncle        blood cancer   Cancer Maternal Uncle        blood cancer   Breast cancer Neg Hx    Pancreatic cancer  Neg Hx    Ovarian cancer Neg Hx    Endometrial cancer Neg Hx    Prostate cancer Neg Hx     Objective:  Blood pressure 136/83, pulse 78, height 5' 4 (1.626 m), weight 130 lb (59 kg), SpO2 98%. General: No acute distress.  Patient appears well-groomed.   Head:  Normocephalic/atraumatic Eyes:  fundi examined but not visualized Neck: supple, no paraspinal tenderness, full range of motion Heart: regular rate and rhythm Neurological Exam: Mental status: alert and oriented to person, place, and time, speech fluent and not dysarthric, language intact. Cranial nerves: CN I: not tested CN II: pupils equal, round and reactive to light, visual fields intact CN III, IV, VI:  full range of motion,  no nystagmus, no ptosis CN V: facial sensation intact. CN VII: upper and lower face symmetric CN VIII: hearing intact CN IX, X: gag intact, uvula midline CN XI: sternocleidomastoid and trapezius muscles intact CN XII: tongue midline Bulk & Tone: normal, no fasciculations. Motor:  muscle strength 5/5 throughout Sensation:  Pinprick and vibratory sensation intact. Deep Tendon Reflexes:  2+ throughout,  toes downgoing.   Finger to nose testing:  Without dysmetria.   Gait:  Normal station and stride.  Romberg negative.    Thank you for allowing me to take part in the care of this patient.  Juliene Dunnings, DO  CC:  Velna Skeeter, MD  Milo Costa, DO

## 2024-03-19 ENCOUNTER — Ambulatory Visit: Admitting: Neurology

## 2024-03-19 ENCOUNTER — Encounter: Payer: Self-pay | Admitting: Neurology

## 2024-03-19 VITALS — BP 136/83 | HR 78 | Ht 64.0 in | Wt 130.0 lb

## 2024-03-19 DIAGNOSIS — I679 Cerebrovascular disease, unspecified: Secondary | ICD-10-CM

## 2024-03-19 DIAGNOSIS — F17210 Nicotine dependence, cigarettes, uncomplicated: Secondary | ICD-10-CM

## 2024-03-19 NOTE — Patient Instructions (Signed)
 MRI findings shows chronic small vessel changes in the brain that we see with age and in people with risk factors (such as smoking).  No further workup warranted.

## 2024-04-02 ENCOUNTER — Other Ambulatory Visit (HOSPITAL_BASED_OUTPATIENT_CLINIC_OR_DEPARTMENT_OTHER): Payer: Self-pay | Admitting: Internal Medicine

## 2024-04-02 DIAGNOSIS — Z136 Encounter for screening for cardiovascular disorders: Secondary | ICD-10-CM

## 2024-04-10 ENCOUNTER — Other Ambulatory Visit (HOSPITAL_COMMUNITY)

## 2024-04-17 ENCOUNTER — Ambulatory Visit (HOSPITAL_COMMUNITY)
Admission: RE | Admit: 2024-04-17 | Discharge: 2024-04-17 | Disposition: A | Discharge status: Home / Self Care | Payer: Self-pay | Source: Ambulatory Visit | Attending: Internal Medicine | Admitting: Internal Medicine

## 2024-04-17 DIAGNOSIS — Z136 Encounter for screening for cardiovascular disorders: Secondary | ICD-10-CM | POA: Insufficient documentation

## 2024-05-10 ENCOUNTER — Other Ambulatory Visit: Payer: Self-pay | Admitting: Internal Medicine

## 2024-05-10 ENCOUNTER — Ambulatory Visit
Admission: RE | Admit: 2024-05-10 | Discharge: 2024-05-10 | Disposition: A | Discharge status: Home / Self Care | Source: Ambulatory Visit | Attending: Internal Medicine | Admitting: Internal Medicine

## 2024-05-10 DIAGNOSIS — M79605 Pain in left leg: Secondary | ICD-10-CM

## 2024-06-06 ENCOUNTER — Inpatient Hospital Stay
Admission: RE | Admit: 2024-06-06 | Discharge: 2024-06-06 | Disposition: A | Source: Ambulatory Visit | Attending: Acute Care | Admitting: Acute Care

## 2024-06-06 DIAGNOSIS — Z122 Encounter for screening for malignant neoplasm of respiratory organs: Secondary | ICD-10-CM

## 2024-06-06 DIAGNOSIS — Z87891 Personal history of nicotine dependence: Secondary | ICD-10-CM

## 2024-06-06 DIAGNOSIS — F17210 Nicotine dependence, cigarettes, uncomplicated: Secondary | ICD-10-CM

## 2024-06-11 ENCOUNTER — Other Ambulatory Visit: Payer: Self-pay

## 2024-06-11 DIAGNOSIS — Z87891 Personal history of nicotine dependence: Secondary | ICD-10-CM

## 2024-06-11 DIAGNOSIS — Z122 Encounter for screening for malignant neoplasm of respiratory organs: Secondary | ICD-10-CM

## 2024-06-11 DIAGNOSIS — F17210 Nicotine dependence, cigarettes, uncomplicated: Secondary | ICD-10-CM

## 2024-06-13 ENCOUNTER — Other Ambulatory Visit: Payer: Self-pay | Admitting: Medical Genetics

## 2024-06-13 DIAGNOSIS — Z006 Encounter for examination for normal comparison and control in clinical research program: Secondary | ICD-10-CM

## 2024-07-20 LAB — GENECONNECT MOLECULAR SCREEN: Genetic Analysis Overall Interpretation: NEGATIVE

## 2024-08-23 ENCOUNTER — Other Ambulatory Visit: Payer: Self-pay | Admitting: Internal Medicine

## 2024-08-23 DIAGNOSIS — Z1231 Encounter for screening mammogram for malignant neoplasm of breast: Secondary | ICD-10-CM

## 2024-08-27 ENCOUNTER — Ambulatory Visit

## 2024-09-04 ENCOUNTER — Ambulatory Visit
Admission: RE | Admit: 2024-09-04 | Discharge: 2024-09-04 | Disposition: A | Source: Ambulatory Visit | Attending: Internal Medicine

## 2024-09-04 DIAGNOSIS — Z1231 Encounter for screening mammogram for malignant neoplasm of breast: Secondary | ICD-10-CM
